# Patient Record
Sex: Male | Born: 1975 | Race: White | Hispanic: No | Marital: Single | Smoking: Current every day smoker
Health system: Southern US, Community
[De-identification: ages and names within clinical notes are randomized; demographics above are authoritative.]

## PROBLEM LIST (undated history)

## (undated) DIAGNOSIS — I10 Essential (primary) hypertension: Secondary | ICD-10-CM

## (undated) DIAGNOSIS — R519 Headache, unspecified: Secondary | ICD-10-CM

## (undated) HISTORY — PX: APPENDECTOMY: SHX54

---

## 2000-11-04 ENCOUNTER — Emergency Department (HOSPITAL_COMMUNITY): Admission: EM | Admit: 2000-11-04 | Discharge: 2000-11-04 | Payer: Self-pay | Admitting: *Deleted

## 2000-11-19 ENCOUNTER — Emergency Department (HOSPITAL_COMMUNITY): Admission: EM | Admit: 2000-11-19 | Discharge: 2000-11-19 | Payer: Self-pay | Admitting: Emergency Medicine

## 2000-11-19 ENCOUNTER — Encounter: Payer: Self-pay | Admitting: Emergency Medicine

## 2001-02-19 ENCOUNTER — Emergency Department (HOSPITAL_COMMUNITY): Admission: EM | Admit: 2001-02-19 | Discharge: 2001-02-19 | Payer: Self-pay | Admitting: Emergency Medicine

## 2001-02-19 ENCOUNTER — Encounter: Payer: Self-pay | Admitting: Emergency Medicine

## 2001-04-05 ENCOUNTER — Emergency Department (HOSPITAL_COMMUNITY): Admission: EM | Admit: 2001-04-05 | Discharge: 2001-04-05 | Payer: Self-pay | Admitting: *Deleted

## 2001-04-22 ENCOUNTER — Emergency Department (HOSPITAL_COMMUNITY): Admission: EM | Admit: 2001-04-22 | Discharge: 2001-04-22 | Payer: Self-pay | Admitting: Emergency Medicine

## 2001-04-24 ENCOUNTER — Emergency Department (HOSPITAL_COMMUNITY): Admission: EM | Admit: 2001-04-24 | Discharge: 2001-04-24 | Payer: Self-pay | Admitting: Emergency Medicine

## 2001-05-09 ENCOUNTER — Encounter: Payer: Self-pay | Admitting: Emergency Medicine

## 2001-05-09 ENCOUNTER — Emergency Department (HOSPITAL_COMMUNITY): Admission: EM | Admit: 2001-05-09 | Discharge: 2001-05-09 | Payer: Self-pay | Admitting: Emergency Medicine

## 2001-07-08 ENCOUNTER — Emergency Department (HOSPITAL_COMMUNITY): Admission: EM | Admit: 2001-07-08 | Discharge: 2001-07-08 | Payer: Self-pay | Admitting: *Deleted

## 2001-08-22 ENCOUNTER — Encounter: Payer: Self-pay | Admitting: *Deleted

## 2001-08-22 ENCOUNTER — Emergency Department (HOSPITAL_COMMUNITY): Admission: EM | Admit: 2001-08-22 | Discharge: 2001-08-22 | Payer: Self-pay | Admitting: *Deleted

## 2001-09-07 ENCOUNTER — Emergency Department (HOSPITAL_COMMUNITY): Admission: EM | Admit: 2001-09-07 | Discharge: 2001-09-07 | Payer: Self-pay | Admitting: Emergency Medicine

## 2001-09-10 ENCOUNTER — Inpatient Hospital Stay (HOSPITAL_COMMUNITY): Admission: EM | Admit: 2001-09-10 | Discharge: 2001-09-12 | Payer: Self-pay | Admitting: Emergency Medicine

## 2001-09-10 ENCOUNTER — Encounter: Payer: Self-pay | Admitting: Emergency Medicine

## 2001-09-23 ENCOUNTER — Emergency Department (HOSPITAL_COMMUNITY): Admission: EM | Admit: 2001-09-23 | Discharge: 2001-09-24 | Payer: Self-pay | Admitting: *Deleted

## 2002-08-06 ENCOUNTER — Emergency Department (HOSPITAL_COMMUNITY): Admission: EM | Admit: 2002-08-06 | Discharge: 2002-08-06 | Payer: Self-pay | Admitting: Emergency Medicine

## 2002-08-06 ENCOUNTER — Encounter: Payer: Self-pay | Admitting: *Deleted

## 2002-10-22 ENCOUNTER — Emergency Department (HOSPITAL_COMMUNITY): Admission: AD | Admit: 2002-10-22 | Discharge: 2002-10-22 | Payer: Self-pay | Admitting: Family Medicine

## 2003-08-22 ENCOUNTER — Emergency Department (HOSPITAL_COMMUNITY): Admission: EM | Admit: 2003-08-22 | Discharge: 2003-08-22 | Payer: Self-pay | Admitting: Emergency Medicine

## 2003-09-07 ENCOUNTER — Emergency Department (HOSPITAL_COMMUNITY): Admission: EM | Admit: 2003-09-07 | Discharge: 2003-09-07 | Payer: Self-pay | Admitting: Emergency Medicine

## 2005-05-02 ENCOUNTER — Emergency Department (HOSPITAL_COMMUNITY): Admission: EM | Admit: 2005-05-02 | Discharge: 2005-05-02 | Payer: Self-pay | Admitting: Emergency Medicine

## 2005-06-01 ENCOUNTER — Encounter (INDEPENDENT_AMBULATORY_CARE_PROVIDER_SITE_OTHER): Payer: Self-pay | Admitting: *Deleted

## 2005-06-01 ENCOUNTER — Other Ambulatory Visit: Admission: RE | Admit: 2005-06-01 | Discharge: 2005-06-01 | Payer: Self-pay | Admitting: Urology

## 2005-10-16 ENCOUNTER — Emergency Department (HOSPITAL_COMMUNITY): Admission: EM | Admit: 2005-10-16 | Discharge: 2005-10-16 | Payer: Self-pay | Admitting: Family Medicine

## 2006-09-17 ENCOUNTER — Emergency Department (HOSPITAL_COMMUNITY): Admission: EM | Admit: 2006-09-17 | Discharge: 2006-09-17 | Payer: Self-pay | Admitting: Emergency Medicine

## 2007-07-07 ENCOUNTER — Emergency Department (HOSPITAL_COMMUNITY): Admission: EM | Admit: 2007-07-07 | Discharge: 2007-07-07 | Payer: Self-pay | Admitting: Emergency Medicine

## 2010-06-03 NOTE — Discharge Summary (Signed)
NAME:  Harold Castillo, Harold Castillo                       ACCOUNT NO.:  [id number]   MEDICAL RECORD NO.:  [id number]                   PATIENT TYPE:  INP   LOCATION:  A320                                 FACILITY:  [facility]   PHYSICIAN:  Dirk Dress. Katrinka Blazing, M.D.                DATE OF BIRTH:  August 22, 1975   DATE OF ADMISSION:  09/09/2001  DATE OF DISCHARGE:  09/12/2001                                 DISCHARGE SUMMARY   DISCHARGE DIAGNOSIS:  Acute appendicitis.   SPECIAL PROCEDURE:  Laparoscopic appendectomy.   DISPOSITION:  Patient discharged home in stable condition and satisfactory  condition, with plans for follow-up in the office in one week.   DISCHARGE MEDICATIONS:  1. Levaquin 500 mg q.d.  2. Tylox 1 q.i.d. p.r.n. pain.   HOSPITAL COURSE:  The patient is a 36 year old male with a five-day history  of diffuse abdominal pain without nausea or vomiting.  The pain localized to  the right lower quadrant and gradually increased in severity.  He was  uncomfortable with all movement.  He was evaluated in the emergency room and  was found to have a thickening of the lining of the appendix with a small  amount of free fluid in the pelvis.  The patient had no other medical  problems.  Physical findings were compatible with acute appendicitis.  White  count was 11,800.  The patient was started on Zosyn and Cleocin.  He was  taken to the operating room where an inflamed appendix was removed  laparoscopically.  He was treated with IV antibiotics for 1-1/2 days  postoperatively.  Otherwise, he did very well.  By September 12, 2001, he was  stable and ready for discharge.  He was tolerating a diet without any nausea  or vomiting, and he was afebrile.  He was discharged home in stable and  satisfactory condition.                                               Dirk Dress. Katrinka Blazing, M.D.    LCS/MEDQ  D:  10/12/2001  T:  10/14/2001  Job:  [id number]

## 2010-06-03 NOTE — Op Note (Signed)
NAME:  Harold Castillo, Harold Castillo                       ACCOUNT NO.:  [id number]   MEDICAL RECORD NO.:  [id number]                   PATIENT TYPE:  INP   LOCATION:  A320                                 FACILITY:  [facility]   PHYSICIAN:  Dirk Dress. Katrinka Blazing, M.D.                DATE OF BIRTH:  10/14/1975   DATE OF PROCEDURE:  09/10/2001  DATE OF DISCHARGE:                                 OPERATIVE REPORT   PREOPERATIVE DIAGNOSIS:  Acute appendicitis.   POSTOPERATIVE DIAGNOSIS:  Acute appendicitis.   PROCEDURE:  Laparoscopic appendectomy.   SURGEON:  Dirk Dress. Katrinka Blazing, M.D.   DESCRIPTION OF PROCEDURE:  Under general anesthesia, the patient's abdomen  was prepped and draped in the sterile field.  A supraumbilical incision was  made.  The Veress needle was inserted uneventfully.  The abdomen was  insufflated with two liters of CO2.  Using Visiport ______, a 10 mm port was  placed uneventfully.  The appendix was visualized.  There was acute  inflammation of the appendix which now appears as appendiceal inflammation  and multiple chronic adhesions to the lateral pelvic gutter.  There was a  small amount of fluid in the pelvis which was suctioned.  A 5 mm port was  placed in the suprapubic midline, and a 12 mm port was placed in the left  lower quadrant under videoscopic guidance.  The appendix was grasped.  Using  blunt dissection, it was separated from the lateral peritoneum.  The vessels  were clipped with multiple hemoclips and divided.  The base of the appendix  was dissected and then was transected using a Endo-GIA stapler.  There was  no bleeding from the staple line.  Copious irrigation was carried out.  Irrigation of the left and right upper quadrant and the pelvis was carried  out.  Inspection of the operative site revealed no oozing or bleeding.  There was no evidence of visceral injury.  The CO2 was allowed to escape  from the abdomen, and the ports were removed.  The incisions were closed  using 0 Dexon on the fascia of the larger incision and staples on the skin.  The patient tolerated the procedure well.  He was awakened from anesthesia  uneventfully, transferred to a bed and taken to the post anesthetic care  unit.                                               Dirk Dress. Katrinka Blazing, M.D.    LCS/MEDQ  D:  09/10/2001  T:  09/11/2001  Job:  [id number]

## 2012-01-14 ENCOUNTER — Encounter (HOSPITAL_COMMUNITY): Payer: Self-pay | Admitting: Emergency Medicine

## 2012-01-14 ENCOUNTER — Emergency Department (HOSPITAL_COMMUNITY): Payer: Self-pay

## 2012-01-14 ENCOUNTER — Emergency Department (HOSPITAL_COMMUNITY)
Admission: EM | Admit: 2012-01-14 | Discharge: 2012-01-14 | Disposition: A | Discharge status: Home / Self Care | Payer: Self-pay | Attending: Emergency Medicine | Admitting: Emergency Medicine

## 2012-01-14 DIAGNOSIS — F17200 Nicotine dependence, unspecified, uncomplicated: Secondary | ICD-10-CM | POA: Insufficient documentation

## 2012-01-14 DIAGNOSIS — S46911A Strain of unspecified muscle, fascia and tendon at shoulder and upper arm level, right arm, initial encounter: Secondary | ICD-10-CM

## 2012-01-14 DIAGNOSIS — IMO0002 Reserved for concepts with insufficient information to code with codable children: Secondary | ICD-10-CM | POA: Insufficient documentation

## 2012-01-14 MED ORDER — OXYCODONE-ACETAMINOPHEN 5-325 MG PO TABS: 1.0000 | ORAL_TABLET | Freq: Four times a day (QID) | ORAL | Status: DC | PRN

## 2012-01-14 MED ORDER — OXYCODONE-ACETAMINOPHEN 5-325 MG PO TABS
1.0000 | ORAL_TABLET | Freq: Once | ORAL | Status: AC
  Administered 2012-01-14: 1 via ORAL
  Filled 2012-01-14: qty 1
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
## 2012-04-23 ENCOUNTER — Encounter (HOSPITAL_COMMUNITY): Payer: Self-pay | Admitting: *Deleted

## 2012-04-23 ENCOUNTER — Emergency Department (HOSPITAL_COMMUNITY)
Admission: EM | Admit: 2012-04-23 | Discharge: 2012-04-23 | Disposition: A | Discharge status: Home / Self Care | Payer: Self-pay | Attending: Emergency Medicine | Admitting: Emergency Medicine

## 2012-04-23 DIAGNOSIS — K029 Dental caries, unspecified: Secondary | ICD-10-CM | POA: Insufficient documentation

## 2012-04-23 DIAGNOSIS — R220 Localized swelling, mass and lump, head: Secondary | ICD-10-CM | POA: Insufficient documentation

## 2012-04-23 DIAGNOSIS — F17200 Nicotine dependence, unspecified, uncomplicated: Secondary | ICD-10-CM | POA: Insufficient documentation

## 2012-04-23 MED ORDER — OXYCODONE-ACETAMINOPHEN 5-325 MG PO TABS: 1.0000 | ORAL_TABLET | Freq: Four times a day (QID) | ORAL | Status: DC | PRN

## 2012-04-23 MED ORDER — BUPIVACAINE HCL (PF) 0.5 % IJ SOLN
50.0000 mL | Freq: Once | INTRAMUSCULAR | Status: DC
  Filled 2012-04-23: qty 30

## 2012-04-23 NOTE — ED Notes (Signed)
Pt states that tooth has been bothering him for about 2 days now it has been chipped for a while but now it is very painful. He states that he cannot chew or bite down on side where his tooth is and that it hurts so bad that he cannot sit still. Pt states he does not have insurance so the dentist will not see him, "he just wants the tooth out now"

## 2012-04-23 NOTE — ED Provider Notes (Signed)
History     CSN: [id number]  Arrival date & time 04/23/12  0133   First MD Initiated Contact with Patient 04/23/12 [phone]      Chief Complaint  Patient presents with  . Dental Pain    (Consider location/radiation/quality/duration/timing/severity/associated sxs/prior treatment) HPI Comments: Harold Castillo has very poor dentition.  Now complaining of right lower molar pain with gum swelling.  States he cannot get into a dentist.  Due to financial concerns and lack of dental insurance  Patient is a 37 y.o. male presenting with tooth pain. The history is provided by the patient.  Dental PainThe primary symptoms include mouth pain. Primary symptoms do not include dental injury or fever. The symptoms began 3 to 5 days ago. The symptoms are worsening. The symptoms occur constantly.  Additional symptoms include: gum swelling and gum tenderness.    History reviewed. No pertinent past medical history.  Past Surgical History  Procedure Laterality Date  . Appendectomy      History reviewed. No pertinent family history.  History  Substance Use Topics  . Smoking status: Current Every Day Smoker  . Smokeless tobacco: Not on file  . Alcohol Use: No      Review of Systems  Constitutional: Negative for fever and chills.  HENT: Positive for dental problem.   All other systems reviewed and are negative.    Allergies  Review of patient's allergies indicates no known allergies.  Home Medications   Current Outpatient Rx  Name  Route  Sig  Dispense  Refill  . acetaminophen (TYLENOL) 500 MG tablet   Oral   Take 500-1,000 mg by mouth every 6 (six) hours as needed. For pain         . oxyCODONE-acetaminophen (PERCOCET/ROXICET) 5-325 MG per tablet   Oral   Take 1 tablet by mouth every 6 (six) hours as needed for pain.           BP 126/82  Pulse 58  Temp(Src) 97.9 F (36.6 C) (Oral)  Resp 20  SpO2 96%  Physical Exam  Nursing note and vitals reviewed. Constitutional: He  appears well-developed and well-nourished.  HENT:  Head: Normocephalic.  Mouth/Throat:    Extensive dental disease.  Multiple care is no tooth without some degree of decay  Eyes: Pupils are equal, round, and reactive to light.  Neck: Normal range of motion.  Cardiovascular: Normal rate.   Pulmonary/Chest: Effort normal.  Musculoskeletal: Normal range of motion.  Lymphadenopathy:    He has no cervical adenopathy.  Neurological: He is alert.  Skin: Skin is warm.    ED Course  Dental Date/Time: 04/23/2012 5:02 AM Performed by: Arman Filter Authorized by: Arman Filter Consent: Verbal consent obtained. Risks and benefits: risks, benefits and alternatives were discussed Consent given by: patient Patient understanding: patient states understanding of the procedure being performed Patient identity confirmed: verbally with patient Time out: Immediately prior to procedure a "time out" was called to verify the correct patient, procedure, equipment, support staff and site/side marked as required. Local anesthesia used: yes Anesthesia: nerve block Local anesthetic: bupivacaine 0.5% with epinephrine Anesthetic total: 0.9 ml Patient sedated: no Patient tolerance: Patient tolerated the procedure well with no immediate complications.   (including critical care time)  Labs Reviewed - No data to display No results found.   No diagnosis found.    MDM  Dental block performed patient will be given, referral to dentist on call, with instructions that he needs to call first thing in the  morning to set an evaluation.  Time         Arman Filter, NP 04/23/12 [phone]

## 2012-04-23 NOTE — ED Notes (Signed)
Right lower bottom  Cavity , pt is tearful,  States it has hurt for two days.

## 2012-04-29 NOTE — ED Provider Notes (Signed)
Medical screening examination/treatment/procedure(s) were performed by non-physician practitioner and as supervising physician I was immediately available for consultation/collaboration.  Hurman Horn, MD 04/29/12 [phone]

## 2012-06-09 ENCOUNTER — Emergency Department (HOSPITAL_COMMUNITY)
Admission: EM | Admit: 2012-06-09 | Discharge: 2012-06-09 | Disposition: A | Discharge status: Home / Self Care | Payer: Self-pay | Attending: Emergency Medicine | Admitting: Emergency Medicine

## 2012-06-09 ENCOUNTER — Encounter (HOSPITAL_COMMUNITY): Payer: Self-pay | Admitting: *Deleted

## 2012-06-09 ENCOUNTER — Emergency Department (HOSPITAL_COMMUNITY): Payer: Self-pay

## 2012-06-09 DIAGNOSIS — I10 Essential (primary) hypertension: Secondary | ICD-10-CM | POA: Insufficient documentation

## 2012-06-09 DIAGNOSIS — F17200 Nicotine dependence, unspecified, uncomplicated: Secondary | ICD-10-CM | POA: Insufficient documentation

## 2012-06-09 DIAGNOSIS — R0789 Other chest pain: Secondary | ICD-10-CM

## 2012-06-09 DIAGNOSIS — R000 Tachycardia, unspecified: Secondary | ICD-10-CM | POA: Insufficient documentation

## 2012-06-09 DIAGNOSIS — R071 Chest pain on breathing: Secondary | ICD-10-CM | POA: Insufficient documentation

## 2012-06-09 HISTORY — DX: Essential (primary) hypertension: I10

## 2012-06-09 LAB — CBC WITH DIFFERENTIAL/PLATELET
Basophils Absolute: 0 10*3/uL (ref 0.0–0.1)
Basophils Relative: 0 % (ref 0–1)
Hemoglobin: 15.8 g/dL (ref 13.0–17.0)
MCHC: 35 g/dL (ref 30.0–36.0)
Neutro Abs: 10.1 10*3/uL — ABNORMAL HIGH (ref 1.7–7.7)
Neutrophils Relative %: 78 % — ABNORMAL HIGH (ref 43–77)
RDW: 12.7 % (ref 11.5–15.5)
WBC: 12.8 10*3/uL — ABNORMAL HIGH (ref 4.0–10.5)

## 2012-06-09 LAB — COMPREHENSIVE METABOLIC PANEL
ALT: 21 U/L (ref 0–53)
AST: 29 U/L (ref 0–37)
Albumin: 4.3 g/dL (ref 3.5–5.2)
Alkaline Phosphatase: 84 U/L (ref 39–117)
Chloride: 102 mEq/L (ref 96–112)
Potassium: 4 mEq/L (ref 3.5–5.1)
Total Bilirubin: 0.6 mg/dL (ref 0.3–1.2)

## 2012-06-09 LAB — PROTIME-INR: Prothrombin Time: 12.8 seconds (ref 11.6–15.2)

## 2012-06-09 MED ORDER — LORAZEPAM 2 MG/ML IJ SOLN
2.0000 mg | Freq: Once | INTRAMUSCULAR | Status: AC
  Administered 2012-06-09: 2 mg via INTRAVENOUS
  Filled 2012-06-09: qty 1

## 2012-06-09 MED ORDER — KETOROLAC TROMETHAMINE 30 MG/ML IJ SOLN
30.0000 mg | Freq: Once | INTRAMUSCULAR | Status: AC
  Administered 2012-06-09: 30 mg via INTRAVENOUS
  Filled 2012-06-09: qty 1

## 2012-06-09 MED ORDER — METHOCARBAMOL 750 MG PO TABS: 750.0000 mg | ORAL_TABLET | Freq: Four times a day (QID) | ORAL | Status: DC

## 2012-06-09 MED ORDER — MORPHINE SULFATE 4 MG/ML IJ SOLN
4.0000 mg | Freq: Once | INTRAMUSCULAR | Status: AC
  Administered 2012-06-09: 4 mg via INTRAVENOUS
  Filled 2012-06-09: qty 1

## 2012-06-09 MED ORDER — OXYCODONE-ACETAMINOPHEN 5-325 MG PO TABS: 2.0000 | ORAL_TABLET | ORAL | Status: DC | PRN

## 2012-06-09 MED ORDER — IBUPROFEN 600 MG PO TABS: 600.0000 mg | ORAL_TABLET | Freq: Four times a day (QID) | ORAL | Status: DC | PRN

## 2012-06-09 NOTE — ED Notes (Signed)
Pt developed chest pain worse with movement, started this morning, symptoms have become worse today.

## 2012-06-09 NOTE — ED Provider Notes (Signed)
History     CSN: [id number]  Arrival date & time 06/09/12  1956   First MD Initiated Contact with Patient 06/09/12 2023      Chief Complaint  Patient presents with  . Chest Pain    (Consider location/radiation/quality/duration/timing/severity/associated sxs/prior treatment) Patient is a 37 y.o. male presenting with chest pain. The history is provided by the patient.  Chest Pain  patient here complaining of chest wall pain since this morning. Describes as a sharp pain worse with movement and taking a deep breath. Does work as a Administrator. Denies any anginal type symptoms. Denies any dyspnea diaphoresis. Used NSAIDs without relief. Denies any rashes to his chest. Denies exertional dyspnea. No prior history of same. Denies the leg pain or swelling  Past Medical History  Diagnosis Date  . Hypertension     Past Surgical History  Procedure Laterality Date  . Appendectomy      No family history on file.  History  Substance Use Topics  . Smoking status: Current Every Day Smoker  . Smokeless tobacco: Not on file  . Alcohol Use: No      Review of Systems  Cardiovascular: Positive for chest pain.  All other systems reviewed and are negative.    Allergies  Review of patient's allergies indicates no known allergies.  Home Medications   Current Outpatient Rx  Name  Route  Sig  Dispense  Refill  . acetaminophen (TYLENOL) 500 MG tablet   Oral   Take 500-1,000 mg by mouth every 6 (six) hours as needed. For pain         . oxyCODONE-acetaminophen (PERCOCET/ROXICET) 5-325 MG per tablet   Oral   Take 1 tablet by mouth every 6 (six) hours as needed for pain.   17 tablet   0     BP 131/81  Pulse 101  Temp(Src) 99.7 F (37.6 C) (Oral)  Resp 20  SpO2 97%  Physical Exam  Nursing note and vitals reviewed. Constitutional: He is oriented to person, place, and time. He appears well-developed and well-nourished.  Non-toxic appearance. No distress.  HENT:  Head:  Normocephalic and atraumatic.  Eyes: Conjunctivae, EOM and lids are normal. Pupils are equal, round, and reactive to light.  Neck: Normal range of motion. Neck supple. No tracheal deviation present. No mass present.  Cardiovascular: Regular rhythm and normal heart sounds.  Tachycardia present.  Exam reveals no gallop.   No murmur heard. Pulmonary/Chest: Effort normal and breath sounds normal. No stridor. No respiratory distress. He has no decreased breath sounds. He has no wheezes. He has no rhonchi. He has no rales.    Abdominal: Soft. Normal appearance and bowel sounds are normal. He exhibits no distension. There is no tenderness. There is no rebound and no CVA tenderness.  Musculoskeletal: Normal range of motion. He exhibits no edema and no tenderness.  Neurological: He is alert and oriented to person, place, and time. He has normal strength. No cranial nerve deficit or sensory deficit. GCS eye subscore is 4. GCS verbal subscore is 5. GCS motor subscore is 6.  Skin: Skin is warm and dry. No abrasion and no rash noted.  Psychiatric: He has a normal mood and affect. His speech is normal and behavior is normal.    ED Course  Procedures (including critical care time)  Labs Reviewed  CBC WITH DIFFERENTIAL - Abnormal; Notable for the following:    WBC 12.8 (*)    Platelets 148 (*)    Neutrophils Relative % 78 (*)  Neutro Abs 10.1 (*)    Lymphocytes Relative 11 (*)    Monocytes Absolute 1.2 (*)    All other components within normal limits  COMPREHENSIVE METABOLIC PANEL  URINALYSIS, ROUTINE W REFLEX MICROSCOPIC  PROTIME-INR  URINE RAPID DRUG SCREEN (HOSP PERFORMED)   No results found.   No diagnosis found.    MDM   Date: 06/09/2012  Rate: 998  Rhythm: normal sinus rhythm  QRS Axis: left  Intervals: normal  ST/T Wave abnormalities: nonspecific ST/T changes  Conduction Disutrbances:none  Narrative Interpretation: lvh  Old EKG Reviewed: none available  11:00 PM Pt given  meds and feels better--suspect chest wall pain, no concern for acs or pe--stable for d/c        Toy Baker, MD 06/09/12 2303

## 2012-06-10 LAB — RAPID URINE DRUG SCREEN, HOSP PERFORMED
Barbiturates: NOT DETECTED
Benzodiazepines: NOT DETECTED
Cocaine: NOT DETECTED
Tetrahydrocannabinol: POSITIVE — AB

## 2012-06-10 LAB — URINALYSIS, ROUTINE W REFLEX MICROSCOPIC
Glucose, UA: NEGATIVE mg/dL
Hgb urine dipstick: NEGATIVE
Ketones, ur: NEGATIVE mg/dL
Protein, ur: NEGATIVE mg/dL

## 2014-10-14 ENCOUNTER — Emergency Department (HOSPITAL_COMMUNITY)
Admission: EM | Admit: 2014-10-14 | Discharge: 2014-10-14 | Disposition: A | Discharge status: Home / Self Care | Payer: Self-pay | Attending: Emergency Medicine | Admitting: Emergency Medicine

## 2014-10-14 ENCOUNTER — Encounter (HOSPITAL_COMMUNITY): Payer: Self-pay | Admitting: Emergency Medicine

## 2014-10-14 DIAGNOSIS — I10 Essential (primary) hypertension: Secondary | ICD-10-CM | POA: Insufficient documentation

## 2014-10-14 DIAGNOSIS — Z79899 Other long term (current) drug therapy: Secondary | ICD-10-CM | POA: Insufficient documentation

## 2014-10-14 DIAGNOSIS — Z720 Tobacco use: Secondary | ICD-10-CM | POA: Insufficient documentation

## 2014-10-14 DIAGNOSIS — K002 Abnormalities of size and form of teeth: Secondary | ICD-10-CM | POA: Insufficient documentation

## 2014-10-14 DIAGNOSIS — K029 Dental caries, unspecified: Secondary | ICD-10-CM | POA: Insufficient documentation

## 2014-10-14 MED ORDER — PENICILLIN V POTASSIUM 500 MG PO TABS: 500.0000 mg | ORAL_TABLET | Freq: Two times a day (BID) | ORAL | Status: AC

## 2014-10-14 MED ORDER — BUPIVACAINE-EPINEPHRINE (PF) 0.5% -1:200000 IJ SOLN
1.8000 mL | Freq: Once | INTRAMUSCULAR | Status: AC
Start: 2014-10-14 — End: 2014-10-14
  Administered 2014-10-14: 1.8 mL
  Filled 2014-10-14: qty 1.8

## 2014-10-14 NOTE — ED Provider Notes (Signed)
CSN: [id number]     Arrival date & time 10/14/14  9147 History   First MD Initiated Contact with Patient 10/14/14 1003     Chief Complaint  Patient presents with  . Dental Pain     (Consider location/radiation/quality/duration/timing/severity/associated sxs/prior Treatment) HPI Comments: Pt is a 39 yo male with PMH of dental caries who presents to the ED with complaint of dental pain, onset 1 week. Pt endorses having worsening pain to his upper and lower left molars. He notes he has been putting Goody's powder on his teeth with mild relief. Pt states he has had multiple dental caries in the past. Pt is currently not being seen by a dentist. Denies fever, chills, headache, neck swelling, dysphagia, SOB, trismus, drainage, swelling.    Past Medical History  Diagnosis Date  . Hypertension    Past Surgical History  Procedure Laterality Date  . Appendectomy     No family history on file. Social History  Substance Use Topics  . Smoking status: Current Every Day Smoker  . Smokeless tobacco: None  . Alcohol Use: No    Review of Systems  Constitutional: Negative for fever and chills.  HENT: Positive for dental problem. Negative for drooling, facial swelling, sore throat and trouble swallowing.   Respiratory: Negative for cough and shortness of breath.   Gastrointestinal: Negative for nausea and vomiting.  Neurological: Negative for numbness.      Allergies  Review of patient's allergies indicates no known allergies.  Home Medications   Prior to Admission medications   Medication Sig Start Date End Date Taking? Authorizing Provider  Aspirin-Acetaminophen-Caffeine (GOODYS EXTRA STRENGTH) [phone] MG PACK Take 1 Package by mouth every 6 (six) hours as needed (for pain).   Yes Historical Provider, MD  ibuprofen (ADVIL,MOTRIN) 200 MG tablet Take 400 mg by mouth every 6 (six) hours as needed for headache, mild pain or moderate pain.   Yes Historical Provider, MD  vitamin B-12  (CYANOCOBALAMIN) 1000 MCG tablet Take 1,000 mcg by mouth daily.   Yes Historical Provider, MD   BP 119/65 mmHg  Pulse 75  Temp(Src) 98.1 F (36.7 C) (Oral)  Resp 17  SpO2 98% Physical Exam  Constitutional: He is oriented to person, place, and time. He appears well-developed and well-nourished. No distress.  HENT:  Head: Normocephalic and atraumatic.  Mouth/Throat: Uvula is midline, oropharynx is clear and moist and mucous membranes are normal. No trismus in the jaw. Abnormal dentition. Dental caries present. No dental abscesses.    Eyes: Conjunctivae and EOM are normal. Right eye exhibits no discharge. Left eye exhibits no discharge. No scleral icterus.  Neck: Normal range of motion. Neck supple.  Pulmonary/Chest: Effort normal.  Lymphadenopathy:    He has no cervical adenopathy.  Neurological: He is alert and oriented to person, place, and time.  Skin: He is not diaphoretic.  Nursing note and vitals reviewed.   ED Course  Procedures (including critical care time) Labs Review Labs Reviewed - No data to display  Imaging Review No results found. I have personally reviewed and evaluated these images and lab results as part of my medical decision-making.  Filed Vitals:   10/14/14 1139  BP: 121/65  Pulse: 78  Temp:   Resp: 18     MDM   Final diagnoses:  Dental caries    Pt presents with dental pain. Pt reports having history of dental problems. He endorses smoking. Denies fever, headache, neck swelling, drainage, SOB, dysphagia. VSS. Poor dentition with multiple decaying teeth  noted on exam, no abscess. Dental block performed by Dr. Gwendolyn Grant to upper and lower left molars. Pt reports pain has improved. Plan to d/c pt with dental follow up.  Evaluation does not show pathology requring ongoing emergent intervention or admission. Pt is hemodynamically stable and mentating appropriately. Discussed findings/results and plan with patient/guardian, who agrees with plan. All  questions answered. Return precautions discussed and outpatient follow up given.    Satira Sark Perry, [place] 10/14/14 1250  Elwin Mocha, MD 10/14/14 [phone]

## 2014-10-14 NOTE — Discharge Instructions (Signed)
Please take your prescription as prescribed. You may take  Ibuprofen three times a day with food for pain relief. Please follow up with a dentist. Please return to the Emergency Department if symptoms worsen or new onset of fever, neck swelling, difficulty swallowing, difficulty breathing.  [facility] [address] [place], [place] [address] Phone [phone]  [facility] [employer] [facility] [place] exemplifies the [employer] vision to improve the health and quality of life of all [place]ians by Public house manager with a passion to care for the underserved and by leading the nation in community-based, service learning oral health education.  We are committed to offering comprehensive general dental services for adults, children and special needs patients in a safe, caring and professional setting.   Appointments: Our clinic is open Monday through Friday 8:00 a.m. until 5:00 p.m. The amount of time scheduled for an appointment depends on the patients specific needs. We ask that you keep your appointed time for care or provide 24-hour notice of all appointment changes. Parents or legal guardians must accompany minor children.   Payment for Services: Medicaid and other insurance plans are welcome. Payment for services is due when services are rendered and may be made by cash or credit card. If you have dental insurance, we will assist you with your claim submission.    Emergencies:  Emergency services will be provided Monday through Friday on a walk-in basis.  Please arrive early for emergency services. After hours emergency services will be provided for patients of record as required.   Services:  Armed forces operational officer Dentistry Oral Surgery - Extractions Root Canals Sealants and Tooth  Colored Fillings Crowns and Bridges Dentures and Partial Dentures Implant Services Periodontal Services and Cleanings Cosmetic Armed forces operational officer 3-D/[facility]

## 2014-10-14 NOTE — ED Notes (Signed)
Per pt, states tooth pain on upper and lower left side

## 2014-10-14 NOTE — ED Provider Notes (Signed)
Medical screening examination/treatment/procedure(s) were conducted as a shared visit with non-physician practitioner(s) and myself.  I personally evaluated the patient during the encounter.   EKG Interpretation None       NERVE BLOCK Date/Time: 10/14/2014 11:26 AM Performed by: Elwin Mocha Authorized by: Elwin Mocha Consent: Verbal consent obtained. Indications: pain relief Body area: face/mouth Nerve: inferior alveolar Laterality: left Patient sedated: no Patient position: sitting Needle gauge: 27 G Location technique: anatomical landmarks Local anesthetic: bupivacaine 0.25% without epinephrine Anesthetic total: 1 ml Outcome: pain improved Patient tolerance: Patient tolerated the procedure well with no immediate complications  NERVE BLOCK Date/Time: 10/14/2014 11:27 AM Performed by: Elwin Mocha Authorized by: Elwin Mocha Consent: Verbal consent obtained. Indications: pain relief Body area: face/mouth Nerve: posterior superior alveolar Laterality: left Patient sedated: no Preparation: Patient was prepped and draped in the usual sterile fashion. Patient position: sitting Needle gauge: 27 G Location technique: anatomical landmarks Local anesthetic: bupivacaine 0.25% without epinephrine Anesthetic total: 1 ml Outcome: pain improved Patient tolerance: Patient tolerated the procedure well with no immediate complications     Elwin Mocha, MD 10/14/14 1127

## 2014-12-30 ENCOUNTER — Encounter (HOSPITAL_COMMUNITY): Payer: Self-pay

## 2014-12-30 ENCOUNTER — Emergency Department (HOSPITAL_COMMUNITY)
Admission: EM | Admit: 2014-12-30 | Discharge: 2014-12-30 | Disposition: A | Discharge status: Home / Self Care | Payer: Self-pay | Attending: Emergency Medicine | Admitting: Emergency Medicine

## 2014-12-30 ENCOUNTER — Emergency Department (HOSPITAL_COMMUNITY): Payer: Self-pay

## 2014-12-30 DIAGNOSIS — S99921A Unspecified injury of right foot, initial encounter: Secondary | ICD-10-CM | POA: Insufficient documentation

## 2014-12-30 DIAGNOSIS — M79671 Pain in right foot: Secondary | ICD-10-CM

## 2014-12-30 DIAGNOSIS — W208XXA Other cause of strike by thrown, projected or falling object, initial encounter: Secondary | ICD-10-CM | POA: Insufficient documentation

## 2014-12-30 DIAGNOSIS — F17200 Nicotine dependence, unspecified, uncomplicated: Secondary | ICD-10-CM | POA: Insufficient documentation

## 2014-12-30 DIAGNOSIS — Y9389 Activity, other specified: Secondary | ICD-10-CM | POA: Insufficient documentation

## 2014-12-30 DIAGNOSIS — Y9289 Other specified places as the place of occurrence of the external cause: Secondary | ICD-10-CM | POA: Insufficient documentation

## 2014-12-30 DIAGNOSIS — Z79899 Other long term (current) drug therapy: Secondary | ICD-10-CM | POA: Insufficient documentation

## 2014-12-30 DIAGNOSIS — Y998 Other external cause status: Secondary | ICD-10-CM | POA: Insufficient documentation

## 2014-12-30 DIAGNOSIS — I10 Essential (primary) hypertension: Secondary | ICD-10-CM | POA: Insufficient documentation

## 2014-12-30 MED ORDER — ACETAMINOPHEN 325 MG PO TABS
650.0000 mg | ORAL_TABLET | Freq: Once | ORAL | Status: AC
  Administered 2014-12-30: 650 mg via ORAL
  Filled 2014-12-30: qty 2

## 2014-12-30 NOTE — Discharge Instructions (Signed)

## 2014-12-30 NOTE — ED Provider Notes (Signed)
CSN: [id number]     Arrival date & time 12/30/14  1809 History   By signing my name below, I, Evon Slack, attest that this documentation has been prepared under the direction and in the presence of Clayton Bosserman, PA-C. Electronically Signed: Evon Slack, ED Scribe. 12/30/2014. 8:30 PM.      Chief Complaint  Patient presents with  . Foot Injury    Patient is a 39 y.o. male presenting with foot injury. The history is provided by the patient. No language interpreter was used.  Foot Injury  HPI Comments: Harold Castillo is a 40 y.o. male who presents to the Emergency Department complaining of right foot injury onset 1 day prior. Pt states that yesterday he dropped a "bottle jack" onto his foot while getting a christmas tree out of his truck. The bottle jack weighs approximately 40 lbs. Pt states that majority of his pain is located on top of the 2nd and 3rd toe. The pain is constant and aggravated by moving his toes. He reports some numbness to the distal 2nd toe. Pt states that he has tried tylenol with mild relief. Pt states that he is able to ambulate but has been putting all his weight on his heel. He states that the pain is worse when ambulating. Pt has no other complaints today.   Past Medical History  Diagnosis Date  . Hypertension    Past Surgical History  Procedure Laterality Date  . Appendectomy     History reviewed. No pertinent family history. Social History  Substance Use Topics  . Smoking status: Current Every Day Smoker -- 1.00 packs/day  . Smokeless tobacco: None  . Alcohol Use: No    Review of Systems  Musculoskeletal: Positive for arthralgias.  Neurological: Positive for numbness.  All other systems reviewed and are negative.    Allergies  Review of patient's allergies indicates no known allergies.  Home Medications   Prior to Admission medications   Medication Sig Start Date End Date Taking? Authorizing Provider  Aspirin-Acetaminophen-Caffeine  (GOODYS EXTRA STRENGTH) [phone] MG PACK Take 1 Package by mouth every 6 (six) hours as needed (for pain).   Yes Historical Provider, MD  vitamin B-12 (CYANOCOBALAMIN) 1000 MCG tablet Take 1,000 mcg by mouth daily.   Yes Historical Provider, MD  ibuprofen (ADVIL,MOTRIN) 200 MG tablet Take 400 mg by mouth every 6 (six) hours as needed for headache, mild pain or moderate pain.    Historical Provider, MD   BP 131/80 mmHg  Pulse 70  Temp(Src) 97.8 F (36.6 C) (Oral)  Resp 18  Ht  (1.803 m)  Wt 65.772 kg  BMI 20.23 kg/m2  SpO2 96%   Physical Exam  Constitutional: He is oriented to person, place, and time. He appears well-developed and well-nourished. No distress.  HENT:  Head: Normocephalic and atraumatic.  Eyes: Conjunctivae and EOM are normal.  Neck: Neck supple. No tracheal deviation present.  Cardiovascular: Normal rate and intact distal pulses.   Pedal pulses palpable. Cap refill less than 2 seconds.  Pulmonary/Chest: Effort normal. No respiratory distress.  Musculoskeletal: Normal range of motion.       Right foot: There is tenderness. There is normal range of motion, no swelling, normal capillary refill and no deformity.       Feet:  Tenderness over the bases of the second and third toes of the right foot. Retains full range of motion of the toes and ankle. No swelling or obvious deformity noted. Foot compartment soft  Neurological:  He is alert and oriented to person, place, and time.  5/5 strength of the bilateral lower extremities. Sensation to light touch intact throughout.  Skin: Skin is warm and dry. No erythema.  No ecchymosis, erythema, abrasions, lacerations or other wounds noted to the right foot  Psychiatric: He has a normal mood and affect. His behavior is normal.  Nursing note and vitals reviewed.   ED Course  Procedures (including critical care time) DIAGNOSTIC STUDIES: Oxygen Saturation is 98% on RA, normal by my interpretation.    COORDINATION OF  CARE: 7:03 PM-Discussed treatment plan with pt at bedside and pt agreed to plan.     Labs Review Labs Reviewed - No data to display  Imaging Review Dg Foot Complete Right  12/30/2014  CLINICAL DATA:  Dropped car jack on RIGHT foot yesterday, RIGHT foot pain, initial encounter EXAM: RIGHT FOOT COMPLETE - 3+ VIEW COMPARISON:  None FINDINGS: Osseous mineralization normal. Joint spaces preserved. No fracture, dislocation, or bone destruction. Tiny plantar calcaneal spur. IMPRESSION: No acute osseous abnormalities. Electronically Signed   By: Ulyses SouthwardMark  Boles M.D.   On: 12/30/2014 19:03      EKG Interpretation None      MDM   Final diagnoses:  Right foot pain   Patient presenting with right foot pain after dropping a heavy object on it. Right foot is neurovascularly intact with FROM. Patient X-Ray negative for obvious fracture or dislocation. Pain managed in ED with tylenol. Pt is able to ambulate with a steady gait using crutches for assistance. Conservative therapy recommended. Discussed RICE therapy and use of OTC pain relievers. Pt advised to follow up with PCP if symptoms persist. Return precautions discussed at bedside and given in discharge paperwork. Pt is stable for discharge.  I personally performed the services described in this documentation, which was scribed in my presence. The recorded information has been reviewed and is accurate.      Alveta HeimlichStevi Nazirah Tri, PA-C 12/30/14 2030  Zadie Rhineonald Wickline, MD 12/30/14 [phone]

## 2014-12-30 NOTE — ED Notes (Signed)
Pt reports dropping a bottle jack on his right foot. Pt reports feeling like his toes feel like they are separating from the joint. Pt reports taking OTC tylenol for pain. PT has limited movement in toes.

## 2018-02-22 ENCOUNTER — Emergency Department (HOSPITAL_COMMUNITY): Payer: Self-pay

## 2018-02-22 ENCOUNTER — Encounter (HOSPITAL_COMMUNITY): Payer: Self-pay

## 2018-02-22 ENCOUNTER — Emergency Department (HOSPITAL_COMMUNITY)
Admission: EM | Admit: 2018-02-22 | Discharge: 2018-02-22 | Disposition: A | Discharge status: Home / Self Care | Payer: Self-pay | Attending: Emergency Medicine | Admitting: Emergency Medicine

## 2018-02-22 DIAGNOSIS — W208XXA Other cause of strike by thrown, projected or falling object, initial encounter: Secondary | ICD-10-CM | POA: Insufficient documentation

## 2018-02-22 DIAGNOSIS — Z23 Encounter for immunization: Secondary | ICD-10-CM | POA: Insufficient documentation

## 2018-02-22 DIAGNOSIS — S6000XA Contusion of unspecified finger without damage to nail, initial encounter: Secondary | ICD-10-CM

## 2018-02-22 DIAGNOSIS — S61215A Laceration without foreign body of left ring finger without damage to nail, initial encounter: Secondary | ICD-10-CM | POA: Insufficient documentation

## 2018-02-22 DIAGNOSIS — I10 Essential (primary) hypertension: Secondary | ICD-10-CM | POA: Insufficient documentation

## 2018-02-22 DIAGNOSIS — Y9389 Activity, other specified: Secondary | ICD-10-CM | POA: Insufficient documentation

## 2018-02-22 DIAGNOSIS — Z79899 Other long term (current) drug therapy: Secondary | ICD-10-CM | POA: Insufficient documentation

## 2018-02-22 DIAGNOSIS — Y999 Unspecified external cause status: Secondary | ICD-10-CM | POA: Insufficient documentation

## 2018-02-22 DIAGNOSIS — F17210 Nicotine dependence, cigarettes, uncomplicated: Secondary | ICD-10-CM | POA: Insufficient documentation

## 2018-02-22 DIAGNOSIS — Y929 Unspecified place or not applicable: Secondary | ICD-10-CM | POA: Insufficient documentation

## 2018-02-22 MED ORDER — TETANUS-DIPHTH-ACELL PERTUSSIS 5-2.5-18.5 LF-MCG/0.5 IM SUSP
0.5000 mL | Freq: Once | INTRAMUSCULAR | Status: AC
  Administered 2018-02-22: 0.5 mL via INTRAMUSCULAR
  Filled 2018-02-22: qty 0.5

## 2018-02-22 MED ORDER — LIDOCAINE HCL (PF) 1 % IJ SOLN
5.0000 mL | Freq: Once | INTRAMUSCULAR | Status: AC
  Administered 2018-02-22: 5 mL
  Filled 2018-02-22: qty 30

## 2018-02-22 NOTE — ED Triage Notes (Signed)
C/o finger laceration  Putting alternator on truck and it fell on his left hand (ring finger). Patient states he will probably need stiches. Bleeding is controled.   A/Ox4 ambulatory in triage.

## 2018-02-22 NOTE — ED Notes (Signed)
MD at bedside. 

## 2018-02-22 NOTE — ED Provider Notes (Signed)
[facility]-EMERGENCY DEPT Provider Note   CSN: [id number] Arrival date & time: 02/22/18  1533     History   Chief Complaint Chief Complaint  Patient presents with  . Laceration    HPI Harold Castillo is a [age] y.o. male who presents to the ED with left finger injury that occurred just prior to arrival to the ED. Patient reports that he was putting an alternator on a truck and it fell on his left ring finger causing a laceration. Patient denies any other injuries.   HPI  Past Medical History:  Diagnosis Date  . Hypertension     There are no active problems to display for this patient.   Past Surgical History:  Procedure Laterality Date  . APPENDECTOMY          Home Medications    Prior to Admission medications   Medication Sig Start Date End Date Taking? Authorizing Provider  ibuprofen (ADVIL,MOTRIN) 200 MG tablet Take 400 mg by mouth every 6 (six) hours as needed for headache, mild pain or moderate pain.    [provider]  vitamin B-12 (CYANOCOBALAMIN) 1000 MCG tablet Take 1,000 mcg by mouth daily.    [provider]    Family History History reviewed. No pertinent family history.  Social History Social History   Tobacco Use  . Smoking status: Current Every Day Smoker    Packs/day: 1.00  . Smokeless tobacco: Never Used  Substance Use Topics  . Alcohol use: No  . Drug use: Yes    Types: Marijuana     Allergies   Patient has no known allergies.   Review of Systems Review of Systems  Musculoskeletal: Positive for arthralgias.  Skin: Positive for wound.  All other systems reviewed and are negative.    Physical Exam Updated Vital Signs BP (!) 118/91   Pulse 66   Temp 97.7 F (36.5 C) (Oral)   Resp 16   Ht 5\' 11"  (1.803 m)   Wt 68 kg   SpO2 100%   BMI 20.92 kg/m   Physical Exam Vitals signs and nursing note reviewed.  Constitutional:      Appearance: He is well-developed.  HENT:     Head:  Normocephalic.     Mouth/Throat:     Mouth: Mucous membranes are moist.  Eyes:     Extraocular Movements: Extraocular movements intact.     Conjunctiva/sclera: Conjunctivae normal.  Neck:     Musculoskeletal: Neck supple.  Cardiovascular:     Rate and Rhythm: Normal rate.  Pulmonary:     Effort: Pulmonary effort is normal.  Musculoskeletal:     Left hand: He exhibits tenderness, decreased capillary refill and laceration. He exhibits normal range of motion and no swelling. Normal sensation noted. Normal strength noted. He exhibits no thumb/finger opposition.     Comments: Laceration to the tip of the left ring finger.   Skin:    General: Skin is warm and dry.  Neurological:     General: No focal deficit present.     Mental Status: He is alert and oriented to person, place, and time.  Psychiatric:        Mood and Affect: Mood normal.      ED Treatments / Results  Labs (all labs ordered are listed, but only abnormal results are displayed) Labs Reviewed - No data to display  Radiology Dg Finger Ring Left  Result Date: 02/22/2018 CLINICAL DATA:  43 y/o  M; laceration the left ring  finger. EXAM: LEFT RING FINGER 2+V COMPARISON:  None. FINDINGS: There is no evidence of fracture or dislocation. There is no evidence of arthropathy or other focal bone abnormality. No radiopaque foreign body identified. IMPRESSION: Negative. Electronically Signed   By: Mitzi HansenLance  Furusawa-Stratton M.D.   On: 02/22/2018 17:45    Procedures .Marland Kitchen.Laceration Repair Date/Time: 02/22/2018 5:31 PM Performed by: Janne NapoleonNeese, Kairen Hallinan M, NP Authorized by: Janne NapoleonNeese, Hendrick Pavich M, NP   Consent:    Consent obtained:  Verbal   Consent given by:  Patient   Risks discussed:  Infection and poor wound healing   Alternatives discussed:  No treatment Anesthesia (see MAR for exact dosages):    Anesthesia method:  Local infiltration   Local anesthetic:  Lidocaine 1% WITH epi Laceration details:    Location:  Finger   Finger location:  L  ring finger Repair type:    Repair type:  Simple Pre-procedure details:    Preparation:  Patient was prepped and draped in usual sterile fashion and imaging obtained to evaluate for foreign bodies Exploration:    Hemostasis achieved with:  Direct pressure   Wound exploration: entire depth of wound probed and visualized     Wound extent: no foreign bodies/material noted, no nerve damage noted, no tendon damage noted and no underlying fracture noted     Contaminated: no   Treatment:    Area cleansed with:  Betadine   Amount of cleaning:  Standard   Irrigation solution:  Sterile water   Irrigation method:  Syringe Skin repair:    Repair method:  Sutures   Suture material:  Fast-absorbing gut   Suture technique:  Simple interrupted   Number of sutures:  4 Approximation:    Approximation:  Close Post-procedure details:    Dressing:  Adhesive bandage and splint for protection   Patient tolerance of procedure:  Tolerated well, no immediate complications Comments:     Tetanus updated   (including critical care time)  Medications Ordered in ED Medications  lidocaine (PF) (XYLOCAINE) 1 % injection 5 mL (5 mLs Infiltration Given 02/22/18 1719)  Tdap (BOOSTRIX) injection 0.5 mL (0.5 mLs Intramuscular Given 02/22/18 1707)     Initial Impression / Assessment and Plan / ED Course  I have reviewed the triage vital signs and the nursing notes. 43 y.o. male here with laceration to the left ring finger after dropping an alternator on his finger stable for d/c without fracture or dislocation noted on x-ray. Discussed with the patient plan of care and he voices understanding and agrees with plan.   Final Clinical Impressions(s) / ED Diagnoses   Final diagnoses:  Laceration of left ring finger without foreign body without damage to nail, initial encounter  Contusion of finger of left hand, initial encounter    ED Discharge Orders    None       Kerrie Buffaloeese, Davy Westmoreland RembertM, TexasNP 02/22/18 1754    Mancel BaleWentz,  [place], MD 02/23/18 [phone]

## 2018-02-22 NOTE — ED Notes (Signed)
X-ray at bedside

## 2018-02-22 NOTE — Discharge Instructions (Signed)
The sutures will absorb. Take tylenol and ibuprofen as needed for pain.

## 2020-04-11 ENCOUNTER — Emergency Department (HOSPITAL_COMMUNITY)
Admission: EM | Admit: 2020-04-11 | Discharge: 2020-04-11 | Disposition: A | Discharge status: Home / Self Care | Payer: Self-pay | Attending: Emergency Medicine | Admitting: Emergency Medicine

## 2020-04-11 ENCOUNTER — Encounter (HOSPITAL_COMMUNITY): Payer: Self-pay | Admitting: Emergency Medicine

## 2020-04-11 ENCOUNTER — Emergency Department (HOSPITAL_COMMUNITY): Payer: Self-pay

## 2020-04-11 DIAGNOSIS — F17200 Nicotine dependence, unspecified, uncomplicated: Secondary | ICD-10-CM | POA: Insufficient documentation

## 2020-04-11 DIAGNOSIS — G43109 Migraine with aura, not intractable, without status migrainosus: Secondary | ICD-10-CM | POA: Insufficient documentation

## 2020-04-11 DIAGNOSIS — I10 Essential (primary) hypertension: Secondary | ICD-10-CM | POA: Insufficient documentation

## 2020-04-11 LAB — CBC WITH DIFFERENTIAL/PLATELET
Abs Immature Granulocytes: 0.03 10*3/uL (ref 0.00–0.07)
Basophils Absolute: 0.1 10*3/uL (ref 0.0–0.1)
Basophils Relative: 1 %
Eosinophils Absolute: 0.1 10*3/uL (ref 0.0–0.5)
Eosinophils Relative: 1 %
HCT: 43.6 % (ref 39.0–52.0)
Hemoglobin: 14.5 g/dL (ref 13.0–17.0)
Immature Granulocytes: 0 %
Lymphocytes Relative: 20 %
Lymphs Abs: 1.9 10*3/uL (ref 0.7–4.0)
MCH: 32.4 pg (ref 26.0–34.0)
MCHC: 33.3 g/dL (ref 30.0–36.0)
MCV: 97.3 fL (ref 80.0–100.0)
Monocytes Absolute: 0.5 10*3/uL (ref 0.1–1.0)
Monocytes Relative: 6 %
Neutro Abs: 6.7 10*3/uL (ref 1.7–7.7)
Neutrophils Relative %: 72 %
Platelets: 150 10*3/uL (ref 150–400)
RBC: 4.48 MIL/uL (ref 4.22–5.81)
RDW: 12.3 % (ref 11.5–15.5)
WBC: 9.3 10*3/uL (ref 4.0–10.5)
nRBC: 0 % (ref 0.0–0.2)

## 2020-04-11 LAB — BASIC METABOLIC PANEL
Anion gap: 8 (ref 5–15)
BUN: 9 mg/dL (ref 6–20)
CO2: 21 mmol/L — ABNORMAL LOW (ref 22–32)
Calcium: 9.1 mg/dL (ref 8.9–10.3)
Chloride: 108 mmol/L (ref 98–111)
Creatinine, Ser: 0.97 mg/dL (ref 0.61–1.24)
GFR, Estimated: >60 mL/min (ref >60)
Glucose, Bld: 91 mg/dL (ref 70–99)
Potassium: 4.2 mmol/L (ref 3.5–5.1)
Sodium: 137 mmol/L (ref 135–145)

## 2020-04-11 MED ORDER — SODIUM CHLORIDE 0.9 % IV BOLUS
500.0000 mL | Freq: Once | INTRAVENOUS | Status: AC
  Administered 2020-04-11: 500 mL via INTRAVENOUS

## 2020-04-11 MED ORDER — KETOROLAC TROMETHAMINE 30 MG/ML IJ SOLN
30.0000 mg | Freq: Once | INTRAMUSCULAR | Status: AC
  Administered 2020-04-11: 30 mg via INTRAVENOUS
  Filled 2020-04-11: qty 1

## 2020-04-11 MED ORDER — DIPHENHYDRAMINE HCL 50 MG/ML IJ SOLN
12.5000 mg | Freq: Once | INTRAMUSCULAR | Status: AC
  Administered 2020-04-11: 12.5 mg via INTRAVENOUS
  Filled 2020-04-11: qty 1

## 2020-04-11 MED ORDER — METOCLOPRAMIDE HCL 5 MG/ML IJ SOLN
10.0000 mg | Freq: Once | INTRAMUSCULAR | Status: AC
  Administered 2020-04-11: 10 mg via INTRAVENOUS
  Filled 2020-04-11: qty 2

## 2020-04-11 MED ORDER — IOHEXOL 350 MG/ML SOLN
75.0000 mL | Freq: Once | INTRAVENOUS | Status: AC | PRN
  Administered 2020-04-11: 75 mL via INTRAVENOUS

## 2020-04-11 MED ORDER — DEXAMETHASONE SODIUM PHOSPHATE 10 MG/ML IJ SOLN
8.0000 mg | Freq: Once | INTRAMUSCULAR | Status: AC
  Administered 2020-04-11: 8 mg via INTRAVENOUS
  Filled 2020-04-11: qty 1
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
## 2020-04-11 NOTE — ED Triage Notes (Signed)
C/o headache/ "pressure behind L eye" since yesterday.  Denies any other symptoms.

## 2020-04-11 NOTE — Discharge Instructions (Addendum)
You should get a phone call in 1-2 business days about an appointment with neurology for your complex migraines.  If you don't hear from them by Wednesday, call the number above to ask for an appointment in their migraine clinic.

## 2020-04-13 ENCOUNTER — Encounter: Payer: Self-pay | Admitting: Neurology
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
## 2020-06-17 ENCOUNTER — Ambulatory Visit: Payer: Self-pay | Admitting: Neurology

## 2021-06-01 IMAGING — CT CT ANGIO NECK
1 of 11 series · 5 of 33 positions shown · IV contrast (omnipaque)
Comparison: None.

CLINICAL DATA: Dizziness, worsening headache, vision changes

EXAM:
CT ANGIOGRAPHY HEAD AND NECK
TECHNIQUE: Multidetector CT imaging of the head and neck was performed using
the standard protocol during bolus administration of intravenous
contrast. Multiplanar CT image reconstructions and MIPs were
obtained to evaluate the vascular anatomy. Carotid stenosis
measurements (when applicable) are obtained utilizing NASCET
criteria, using the distal internal carotid diameter as the
denominator.
CONTRAST:  75mL OMNIPAQUE IOHEXOL 350 MG/ML SOLN

[Series 11: ax thins · axial · 0.39mm/px · z∈[+1152,+1392]mm · 5 of 361 slices shown]
[im 61/361  soft-tissue]
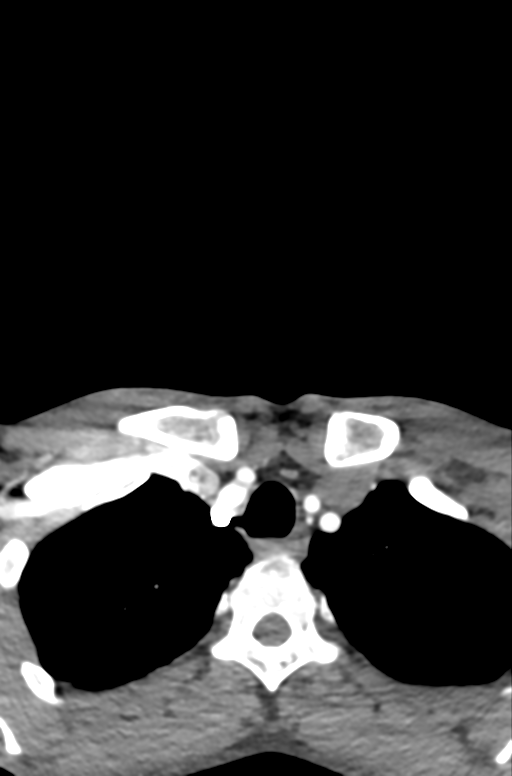
[im 121/361  bone]
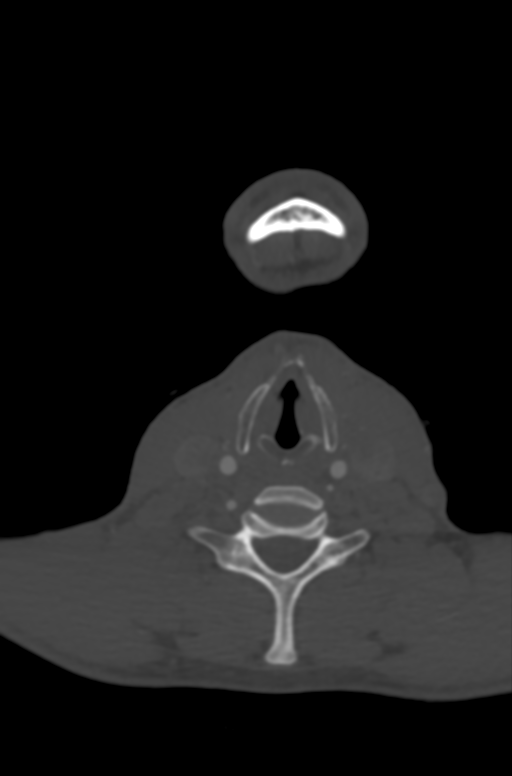
[im 181/361  soft-tissue]
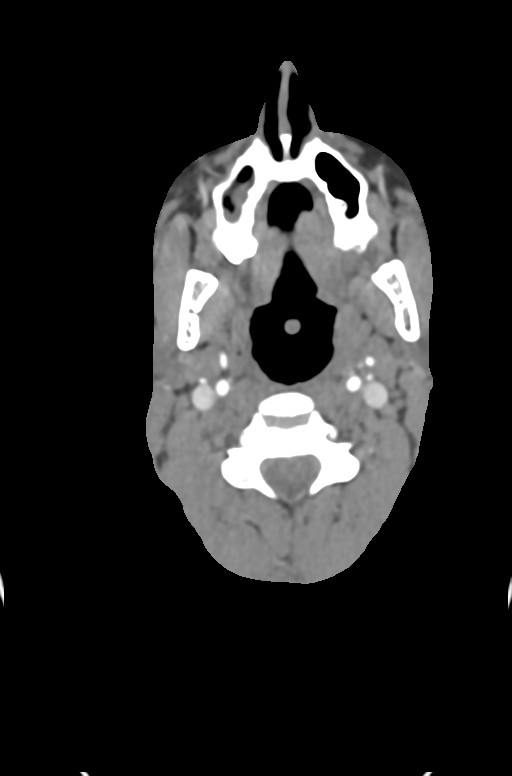
[im 241/361  bone]
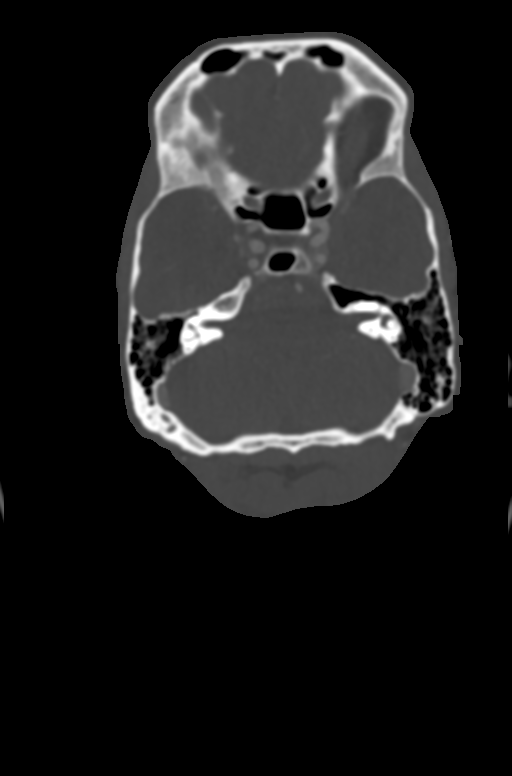
[im 301/361  soft-tissue]
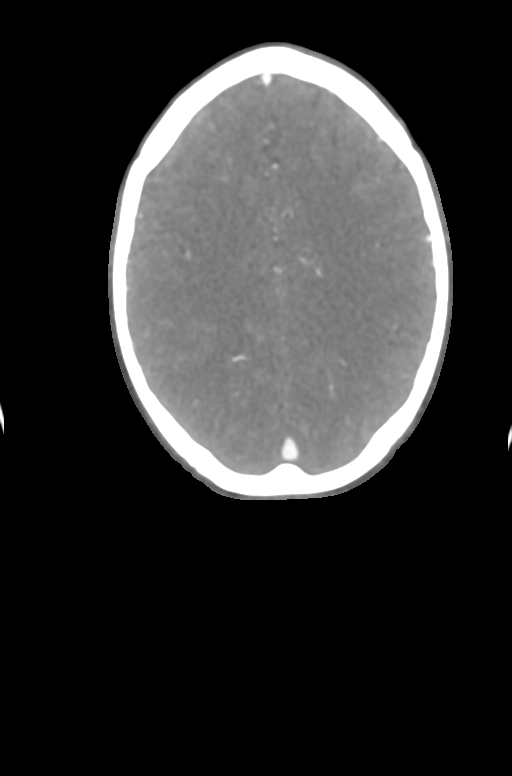

[5 of 33 positions shown; findings below may reference images not displayed]

FINDINGS: CT HEAD

Brain: There is no acute intracranial hemorrhage, mass effect, or
edema. Gray-white differentiation is preserved. There is no
extra-axial fluid collection. Ventricles and sulci are within normal
limits in size and configuration.

Vascular: No hyperdense vessel.

Skull: Calvarium is unremarkable.

Sinuses/Orbits: No acute finding.

Other: None.

Review of the MIP images confirms the above findings

CTA NECK

Aortic arch: Great vessel origins are patent. There is direct origin
of the left vertebral artery from the arch.

Right carotid system: Patent.  Stenosis or evidence of dissection.

Left carotid system: Patent.  No stenosis or evidence of dissection.

Vertebral arteries: Patent.  Right vertebral artery is dominant.

Skeleton: Mild degenerative changes primarily at C3-C4 and C4-C5.

Other neck: Multifocal tooth decay.  No neck mass or adenopathy.

Upper chest: Included upper lungs are clear. Mild paraseptal
emphysema at the apices.

Review of the MIP images confirms the above findings

CTA HEAD

Anterior circulation: Intracranial internal carotid arteries are
patent. Anterior and middle cerebral arteries are patent.

Posterior circulation: Intracranial vertebral arteries, basilar
artery, and posterior cerebral arteries are patent.

Venous sinuses: Patent as allowed by contrast bolus timing.

Review of the MIP images confirms the above findings
IMPRESSION: No acute intracranial abnormality.

No large vessel occlusion, hemodynamically significant stenosis, or
evidence of dissection.

## 2021-06-01 IMAGING — CT CT ANGIO HEAD
1 of 11 series · 5 of 33 positions shown · IV contrast (APPLIED)
Comparison: None.

CLINICAL DATA: Dizziness, worsening headache, vision changes

EXAM:
CT ANGIOGRAPHY HEAD AND NECK
TECHNIQUE: Multidetector CT imaging of the head and neck was performed using
the standard protocol during bolus administration of intravenous
contrast. Multiplanar CT image reconstructions and MIPs were
obtained to evaluate the vascular anatomy. Carotid stenosis
measurements (when applicable) are obtained utilizing NASCET
criteria, using the distal internal carotid diameter as the
denominator.
CONTRAST:  75mL OMNIPAQUE IOHEXOL 350 MG/ML SOLN

[Series 11: ax thins · axial · 0.39mm/px · z∈[+1152,+1392]mm · 5 of 361 slices shown]
[im 61/361  soft-tissue]
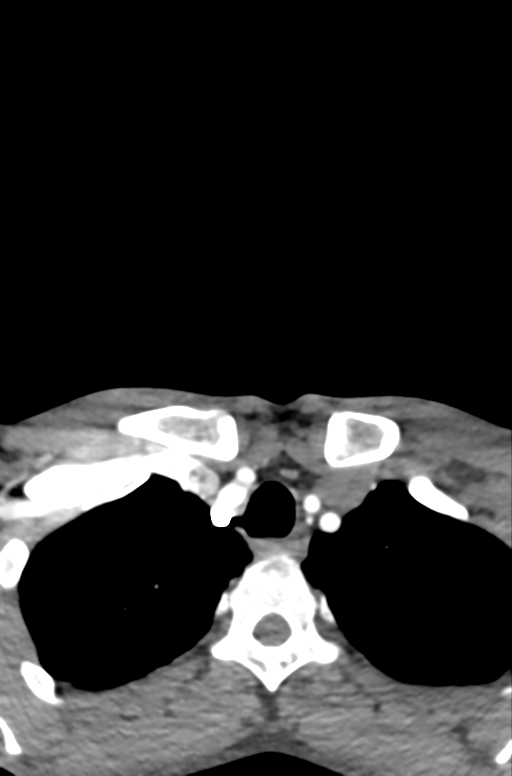
[im 121/361  bone]
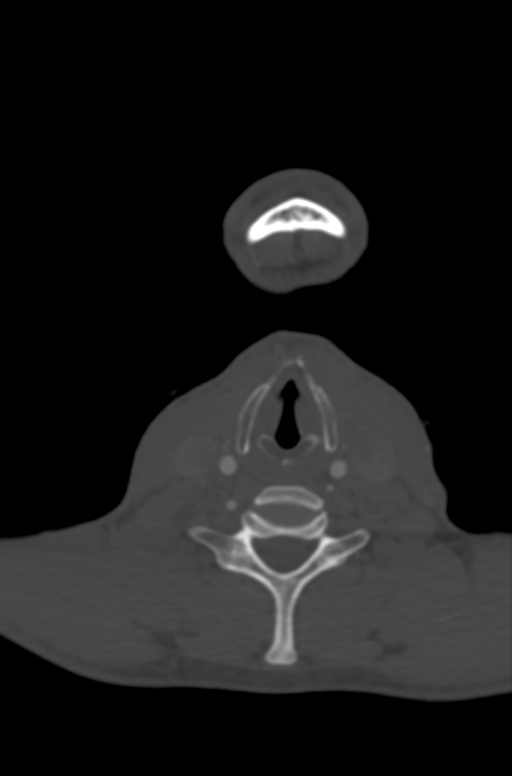
[im 181/361  soft-tissue]
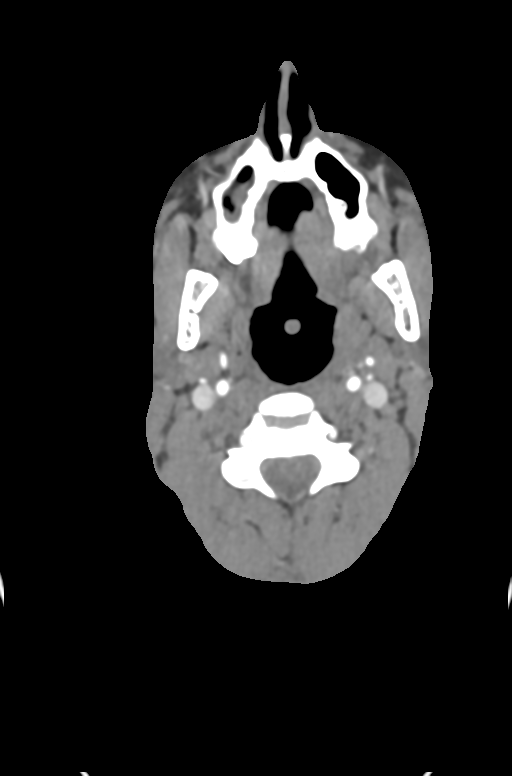
[im 241/361  bone]
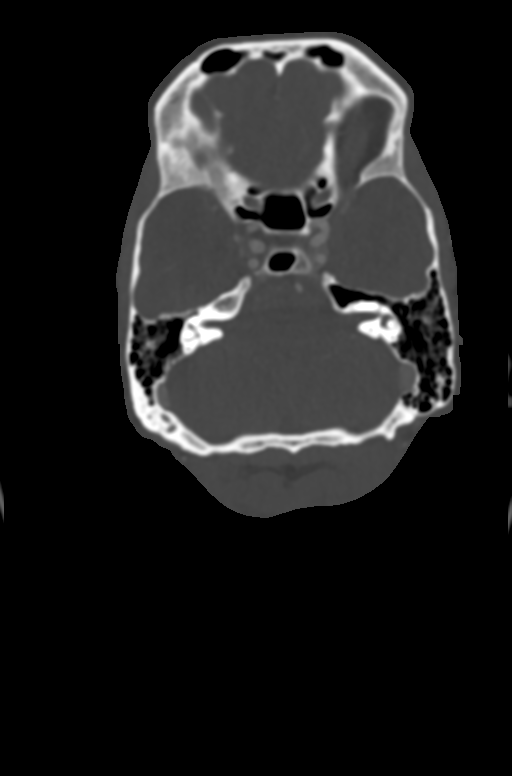
[im 301/361  soft-tissue]
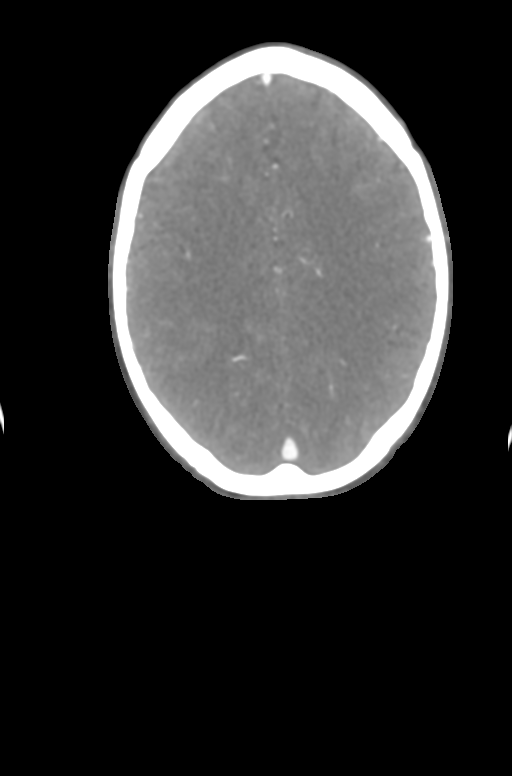

[5 of 33 positions shown; findings below may reference images not displayed]

FINDINGS: CT HEAD

Brain: There is no acute intracranial hemorrhage, mass effect, or
edema. Gray-white differentiation is preserved. There is no
extra-axial fluid collection. Ventricles and sulci are within normal
limits in size and configuration.

Vascular: No hyperdense vessel.

Skull: Calvarium is unremarkable.

Sinuses/Orbits: No acute finding.

Other: None.

Review of the MIP images confirms the above findings

CTA NECK

Aortic arch: Great vessel origins are patent. There is direct origin
of the left vertebral artery from the arch.

Right carotid system: Patent.  Stenosis or evidence of dissection.

Left carotid system: Patent.  No stenosis or evidence of dissection.

Vertebral arteries: Patent.  Right vertebral artery is dominant.

Skeleton: Mild degenerative changes primarily at C3-C4 and C4-C5.

Other neck: Multifocal tooth decay.  No neck mass or adenopathy.

Upper chest: Included upper lungs are clear. Mild paraseptal
emphysema at the apices.

Review of the MIP images confirms the above findings

CTA HEAD

Anterior circulation: Intracranial internal carotid arteries are
patent. Anterior and middle cerebral arteries are patent.

Posterior circulation: Intracranial vertebral arteries, basilar
artery, and posterior cerebral arteries are patent.

Venous sinuses: Patent as allowed by contrast bolus timing.

Review of the MIP images confirms the above findings
IMPRESSION: No acute intracranial abnormality.

No large vessel occlusion, hemodynamically significant stenosis, or
evidence of dissection.

## 2022-01-28 ENCOUNTER — Other Ambulatory Visit: Payer: Self-pay

## 2022-01-28 ENCOUNTER — Emergency Department (HOSPITAL_COMMUNITY)
Admission: EM | Admit: 2022-01-28 | Discharge: 2022-01-29 | Discharge status: Against Medical Advice | Payer: Self-pay | Attending: Emergency Medicine | Admitting: Emergency Medicine

## 2022-01-28 DIAGNOSIS — Z5321 Procedure and treatment not carried out due to patient leaving prior to being seen by health care provider: Secondary | ICD-10-CM | POA: Insufficient documentation

## 2022-01-28 DIAGNOSIS — R519 Headache, unspecified: Secondary | ICD-10-CM | POA: Insufficient documentation

## 2022-01-28 DIAGNOSIS — R112 Nausea with vomiting, unspecified: Secondary | ICD-10-CM | POA: Insufficient documentation

## 2022-01-28 LAB — COMPREHENSIVE METABOLIC PANEL
ALT: 27 U/L (ref 0–44)
AST: 29 U/L (ref 15–41)
Albumin: 5.7 g/dL — ABNORMAL HIGH (ref 3.5–5.0)
Alkaline Phosphatase: 91 U/L (ref 38–126)
Anion gap: 15 (ref 5–15)
BUN: 20 mg/dL (ref 6–20)
CO2: 23 mmol/L (ref 22–32)
Calcium: 11.1 mg/dL — ABNORMAL HIGH (ref 8.9–10.3)
Chloride: 101 mmol/L (ref 98–111)
Creatinine, Ser: 2.23 mg/dL — ABNORMAL HIGH (ref 0.61–1.24)
GFR, Estimated: 36 mL/min — ABNORMAL LOW (ref >60)
Glucose, Bld: 155 mg/dL — ABNORMAL HIGH (ref 70–99)
Potassium: 5 mmol/L (ref 3.5–5.1)
Sodium: 139 mmol/L (ref 135–145)
Total Bilirubin: 0.7 mg/dL (ref 0.3–1.2)
Total Protein: 9 g/dL — ABNORMAL HIGH (ref 6.5–8.1)

## 2022-01-28 LAB — CBC WITH DIFFERENTIAL/PLATELET
Abs Immature Granulocytes: 0.12 10*3/uL — ABNORMAL HIGH (ref 0.00–0.07)
Basophils Absolute: 0.1 10*3/uL (ref 0.0–0.1)
Basophils Relative: 0 %
Eosinophils Absolute: 0 10*3/uL (ref 0.0–0.5)
Eosinophils Relative: 0 %
HCT: 54.3 % — ABNORMAL HIGH (ref 39.0–52.0)
Hemoglobin: 19 g/dL — ABNORMAL HIGH (ref 13.0–17.0)
Immature Granulocytes: 1 %
Lymphocytes Relative: 12 %
Lymphs Abs: 2.1 10*3/uL (ref 0.7–4.0)
MCH: 32.4 pg (ref 26.0–34.0)
MCHC: 35 g/dL (ref 30.0–36.0)
MCV: 92.5 fL (ref 80.0–100.0)
Monocytes Absolute: 1.2 10*3/uL — ABNORMAL HIGH (ref 0.1–1.0)
Monocytes Relative: 6 %
Neutro Abs: 15 10*3/uL — ABNORMAL HIGH (ref 1.7–7.7)
Neutrophils Relative %: 81 %
Platelets: 270 10*3/uL (ref 150–400)
RBC: 5.87 MIL/uL — ABNORMAL HIGH (ref 4.22–5.81)
RDW: 12.4 % (ref 11.5–15.5)
WBC: 18.5 10*3/uL — ABNORMAL HIGH (ref 4.0–10.5)
nRBC: 0 % (ref 0.0–0.2)

## 2022-01-28 NOTE — ED Triage Notes (Signed)
Pt arrives with c/o headache that started about 3 day ago. Pt has hx of migraines. Pt endorses n/v.

## 2022-01-29 ENCOUNTER — Encounter (HOSPITAL_COMMUNITY): Payer: Self-pay

## 2022-01-29 ENCOUNTER — Other Ambulatory Visit: Payer: Self-pay

## 2022-01-29 ENCOUNTER — Emergency Department (HOSPITAL_COMMUNITY)
Admission: EM | Admit: 2022-01-29 | Discharge: 2022-01-29 | Disposition: A | Discharge status: Home / Self Care | Payer: Self-pay | Attending: Emergency Medicine | Admitting: Emergency Medicine

## 2022-01-29 DIAGNOSIS — R7989 Other specified abnormal findings of blood chemistry: Secondary | ICD-10-CM | POA: Insufficient documentation

## 2022-01-29 DIAGNOSIS — D72829 Elevated white blood cell count, unspecified: Secondary | ICD-10-CM | POA: Insufficient documentation

## 2022-01-29 DIAGNOSIS — G43009 Migraine without aura, not intractable, without status migrainosus: Secondary | ICD-10-CM | POA: Insufficient documentation

## 2022-01-29 DIAGNOSIS — I10 Essential (primary) hypertension: Secondary | ICD-10-CM | POA: Insufficient documentation

## 2022-01-29 DIAGNOSIS — Z79899 Other long term (current) drug therapy: Secondary | ICD-10-CM | POA: Insufficient documentation

## 2022-01-29 LAB — BASIC METABOLIC PANEL
Anion gap: 20 — ABNORMAL HIGH (ref 5–15)
BUN: 33 mg/dL — ABNORMAL HIGH (ref 6–20)
CO2: 16 mmol/L — ABNORMAL LOW (ref 22–32)
Calcium: 10.2 mg/dL (ref 8.9–10.3)
Chloride: 95 mmol/L — ABNORMAL LOW (ref 98–111)
Creatinine, Ser: 3.3 mg/dL — ABNORMAL HIGH (ref 0.61–1.24)
GFR, Estimated: 23 mL/min — ABNORMAL LOW (ref >60)
Glucose, Bld: 127 mg/dL — ABNORMAL HIGH (ref 70–99)
Potassium: 4.6 mmol/L (ref 3.5–5.1)
Sodium: 131 mmol/L — ABNORMAL LOW (ref 135–145)

## 2022-01-29 LAB — CBC
HCT: 53.3 % — ABNORMAL HIGH (ref 39.0–52.0)
Hemoglobin: 18.4 g/dL — ABNORMAL HIGH (ref 13.0–17.0)
MCH: 32.1 pg (ref 26.0–34.0)
MCHC: 34.5 g/dL (ref 30.0–36.0)
MCV: 93 fL (ref 80.0–100.0)
Platelets: 244 10*3/uL (ref 150–400)
RBC: 5.73 MIL/uL (ref 4.22–5.81)
RDW: 12.6 % (ref 11.5–15.5)
WBC: 18.9 10*3/uL — ABNORMAL HIGH (ref 4.0–10.5)
nRBC: 0 % (ref 0.0–0.2)

## 2022-01-29 MED ORDER — PROCHLORPERAZINE EDISYLATE 10 MG/2ML IJ SOLN
10.0000 mg | INTRAMUSCULAR | Status: AC
  Administered 2022-01-29: 10 mg via INTRAVENOUS
  Filled 2022-01-29: qty 2

## 2022-01-29 MED ORDER — SODIUM CHLORIDE 0.9 % IV BOLUS
1000.0000 mL | Freq: Once | INTRAVENOUS | Status: AC
  Administered 2022-01-29: 1000 mL via INTRAVENOUS

## 2022-01-29 MED ORDER — ONDANSETRON HCL 4 MG PO TABS: 4.0000 mg | ORAL_TABLET | Freq: Four times a day (QID) | ORAL | 0 refills | Status: DC

## 2022-01-29 MED ORDER — DEXAMETHASONE SODIUM PHOSPHATE 10 MG/ML IJ SOLN
10.0000 mg | Freq: Once | INTRAMUSCULAR | Status: AC
  Administered 2022-01-29: 10 mg via INTRAVENOUS
  Filled 2022-01-29: qty 1

## 2022-01-29 MED ORDER — KETOROLAC TROMETHAMINE 15 MG/ML IJ SOLN
15.0000 mg | Freq: Once | INTRAMUSCULAR | Status: AC
  Administered 2022-01-29: 15 mg via INTRAVENOUS
  Filled 2022-01-29: qty 1

## 2022-01-29 MED ORDER — DIPHENHYDRAMINE HCL 50 MG/ML IJ SOLN
25.0000 mg | Freq: Once | INTRAMUSCULAR | Status: AC
  Administered 2022-01-29: 25 mg via INTRAVENOUS
  Filled 2022-01-29: qty 1

## 2022-01-29 NOTE — ED Provider Notes (Signed)
[name] Note   CSN: [id number] Arrival date & time: 01/29/22  0900     History  Chief Complaint  Patient presents with   Migraine    Harold Castillo is a 47 y.o. male with medical history of hypertension, migraines.  Patient presents to ED for evaluation of migraine.  Patient reports that for the last 3 days he has had waxing and waning migraine.  Patient denies any new features to this migraine, states that this is a standard migraine for him.  Patient states that he cannot afford abortive medication such as sumatriptan and so he has only been taking over-the-counter medications such as Tylenol and ibuprofen which have not been relieving symptoms.  Patient endorsing nausea and vomiting however denies any one-sided weakness or numbness, chest pain, shortness of breath, syncope.  Patient reports that migraine came on gradually, denies any thunderclap presentation.   Migraine Associated symptoms include headaches. Pertinent negatives include no chest pain and no shortness of breath.       Home Medications Prior to Admission medications   Medication Sig Start Date End Date Taking? Authorizing Provider  ibuprofen (ADVIL,MOTRIN) 200 MG tablet Take 400 mg by mouth every 6 (six) hours as needed for headache, mild pain or moderate pain.    [provider]  vitamin B-12 (CYANOCOBALAMIN) 1000 MCG tablet Take 1,000 mcg by mouth daily.    [provider]      Allergies    Patient has no known allergies.    Review of Systems   Review of Systems  Respiratory:  Negative for shortness of breath.   Cardiovascular:  Negative for chest pain.  Gastrointestinal:  Positive for nausea and vomiting. Negative for diarrhea.  Neurological:  Positive for headaches. Negative for syncope, weakness and numbness.  All other systems reviewed and are negative.   Physical Exam Updated Vital Signs BP 131/89   Pulse 70   Temp 98.1 F  (36.7 C) (Oral)   Resp 16   Ht 5\' 11"  (1.803 m)   Wt 68 kg   SpO2 98%   BMI 20.92 kg/m  Physical Exam Vitals and nursing note reviewed.  Constitutional:      General: He is not in acute distress.    Appearance: Normal appearance. He is not ill-appearing, toxic-appearing or diaphoretic.  HENT:     Head: Normocephalic and atraumatic.     Nose: Nose normal. No congestion.     Mouth/Throat:     Mouth: Mucous membranes are moist.     Pharynx: Oropharynx is clear.  Eyes:     Extraocular Movements: Extraocular movements intact.     Conjunctiva/sclera: Conjunctivae normal.     Pupils: Pupils are equal, round, and reactive to light.  Cardiovascular:     Rate and Rhythm: Normal rate and regular rhythm.  Pulmonary:     Effort: Pulmonary effort is normal.     Breath sounds: Normal breath sounds. No wheezing.  Abdominal:     General: Abdomen is flat. Bowel sounds are normal.     Palpations: Abdomen is soft.     Tenderness: There is no abdominal tenderness.  Musculoskeletal:     Cervical back: Normal range of motion and neck supple.  Skin:    General: Skin is warm and dry.     Capillary Refill: Capillary refill takes less than 2 seconds.  Neurological:     General: No focal deficit present.     Mental Status: He is alert  and oriented to person, place, and time.     GCS: GCS eye subscore is 4. GCS verbal subscore is 5. GCS motor subscore is 6.     Cranial Nerves: Cranial nerves 2-12 are intact. No cranial nerve deficit.     Sensory: Sensation is intact. No sensory deficit.     Motor: Motor function is intact. No weakness.     Coordination: Coordination is intact. Heel to [facility] Test normal.     ED Results / Procedures / Treatments   Labs (all labs ordered are listed, but only abnormal results are displayed) Labs Reviewed  CBC - Abnormal; Notable for the following components:      Result Value   WBC 18.9 (*)    Hemoglobin 18.4 (*)    HCT 53.3 (*)    All other components within  normal limits  BASIC METABOLIC PANEL - Abnormal; Notable for the following components:   Sodium 131 (*)    Chloride 95 (*)    CO2 16 (*)    Glucose, Bld 127 (*)    BUN 33 (*)    Creatinine, Ser 3.30 (*)    GFR, Estimated 23 (*)    Anion gap 20 (*)    All other components within normal limits    EKG None  Radiology No results found.  Procedures Procedures   Medications Ordered in ED Medications  prochlorperazine (COMPAZINE) injection 10 mg (10 mg Intravenous Given 01/29/22 1154)  diphenhydrAMINE (BENADRYL) injection 25 mg (25 mg Intravenous Given 01/29/22 1155)  ketorolac (TORADOL) 15 MG/ML injection 15 mg (15 mg Intravenous Given 01/29/22 1154)  dexamethasone (DECADRON) injection 10 mg (10 mg Intravenous Given 01/29/22 1154)  sodium chloride 0.9 % bolus 1,000 mL (0 mLs Intravenous Stopped 01/29/22 1257)  sodium chloride 0.9 % bolus 1,000 mL (0 mLs Intravenous Stopped 01/29/22 1400)    ED Course/ Medical Decision Making/ A&P }                          Medical Decision Making Amount and/or Complexity of Data Reviewed Labs: ordered.  Risk Prescription drug management.   47 year old man presents to ED for evaluation.  Please see HPI for further details.  On examination the patient is afebrile and nontachycardic.  The patient sounds are clear bilaterally, he is not hypoxic.  Patient abdomen soft and compressible throughout.  Patient neurological examination shows no focal neurodeficits.  Patient workup to include CBC, BMP to assess for electrolyte derangements as well as kidney function.  Patient initially provided migraine cocktail to include 10 mg Compazine, 50 mg Toradol, 10 mg Decadron, 25 mg Benadryl, 1 L fluid.  Patient CBC with an elevated white blood cell count 18.9 however no fever, not tachycardic.  Unsure source.  Patient BMP with decreased sodium to 131, elevated creatinine at 3.3.  This is indicative of an AKI.  Patient creatinine yesterday was 2.2.  Patient was  provided with 2 L of fluid total.  While here in the department the patient has had no nausea or vomiting.  On reassessment, the patient reports that he feels much better.  Admission consideration was given to this patient however the patient is had no nausea or vomiting while here.  After discussion with my attending, Dr. Melina Copa, we feel that this patient is stable for discharge.  I advised the patient that he will need to continue hydrating himself as an outpatient for the rest of the day and into tomorrow.  I  advised the patient that he will need to return in 2 days for recheck of labs to assess kidney function as well as repeat CBC to assess leukocytosis.  Plan of management discussed with Dr. Charm Barges who voices agreement with plan.  Patient amenable to plan.  The patient was given strict return precautions to include return of nausea or vomiting and he voiced understanding.  The patient had all his questions answered to satisfaction.  The patient is stable for discharge.  Final Clinical Impression(s) / ED Diagnoses Final diagnoses:  Migraine without aura and without status migrainosus, not intractable    Rx / DC Orders ED Discharge Orders     None         Clent Ridges 01/29/22 1428    Terrilee Files, MD 01/29/22 1731

## 2022-01-29 NOTE — ED Triage Notes (Signed)
Reports migraine x 4 days was here yesterday and couldn't wait.  Reports n/v. Reports this is his typical migraine.  Requesting migraine cocktail.

## 2022-01-29 NOTE — Discharge Instructions (Addendum)
Return to the ED with any new or worsening symptoms such as nausea or vomiting. Your creatinine was noted to be high here today.  You will need to continue hydrating yourself as an outpatient.  Please drink 2 bottles of water per hour.  Please return to the ED in 2 days for reassessment of kidney function and white blood cell count. Please read attached guide concerning, migraines Please take the prescribed antinausea medication

## 2022-11-02 ENCOUNTER — Other Ambulatory Visit: Payer: Self-pay

## 2022-11-02 ENCOUNTER — Emergency Department (HOSPITAL_COMMUNITY)
Admission: EM | Admit: 2022-11-02 | Discharge: 2022-11-02 | Disposition: A | Discharge status: Home / Self Care | Payer: Self-pay | Attending: Emergency Medicine | Admitting: Emergency Medicine

## 2022-11-02 ENCOUNTER — Encounter (HOSPITAL_COMMUNITY): Payer: Self-pay | Admitting: Emergency Medicine

## 2022-11-02 DIAGNOSIS — G43001 Migraine without aura, not intractable, with status migrainosus: Secondary | ICD-10-CM | POA: Insufficient documentation

## 2022-11-02 MED ORDER — PROCHLORPERAZINE EDISYLATE 10 MG/2ML IJ SOLN
10.0000 mg | Freq: Once | INTRAMUSCULAR | Status: AC
  Administered 2022-11-02: 10 mg via INTRAVENOUS
  Filled 2022-11-02: qty 2

## 2022-11-02 MED ORDER — KETOROLAC TROMETHAMINE 30 MG/ML IJ SOLN
30.0000 mg | Freq: Once | INTRAMUSCULAR | Status: AC
  Administered 2022-11-02: 30 mg via INTRAVENOUS
  Filled 2022-11-02: qty 1

## 2022-11-02 MED ORDER — DEXAMETHASONE SODIUM PHOSPHATE 10 MG/ML IJ SOLN
10.0000 mg | Freq: Once | INTRAMUSCULAR | Status: AC
  Administered 2022-11-02: 10 mg via INTRAVENOUS
  Filled 2022-11-02: qty 1

## 2022-11-02 NOTE — ED Provider Notes (Signed)
[place] EMERGENCY DEPARTMENT AT [facility] Provider Note   CSN: [id number] Arrival date & time: 11/02/22  1045     History  Chief Complaint  Patient presents with   Headache    Harold Castillo is a 47 y.o. male.  He has a history of migraines.  Complaining of a migraine headache for 3 days.  Generalized throbbing, feeling like his eyes are, fall out of his head associated with some photophobia.  No fevers no recent head injuries.  Ibuprofen not helping.  Symptoms are typical of his headaches.  The history is provided by the patient.  Headache Pain location:  Generalized Quality:  Dull Severity currently:  10/10 Severity at highest:  10/10 Onset quality:  Gradual Duration:  3 days Timing:  Constant Progression:  Unchanged Chronicity:  Recurrent Similar to prior headaches: yes   Relieved by:  Nothing Worsened by:  Light and activity Ineffective treatments:  NSAIDs Associated symptoms: blurred vision, eye pain, facial pain and visual change   Associated symptoms: no abdominal pain, no fever, no focal weakness, no neck pain and no vomiting        Home Medications Prior to Admission medications   Medication Sig Start Date End Date Taking? Authorizing Provider  ibuprofen (ADVIL,MOTRIN) 200 MG tablet Take 400 mg by mouth every 6 (six) hours as needed for headache, mild pain or moderate pain.    [provider]  ondansetron (ZOFRAN) 4 MG tablet Take 1 tablet (4 mg total) by mouth every 6 (six) hours. 01/29/22   Al Decant, PA-C  vitamin B-12 (CYANOCOBALAMIN) 1000 MCG tablet Take 1,000 mcg by mouth daily.    [provider]      Allergies    Patient has no known allergies.    Review of Systems   Review of Systems  Constitutional:  Negative for fever.  Eyes:  Positive for blurred vision, pain and visual disturbance.  Respiratory:  Negative for shortness of breath.   Cardiovascular:  Negative for chest pain.  Gastrointestinal:   Negative for abdominal pain and vomiting.  Musculoskeletal:  Negative for neck pain.  Neurological:  Positive for headaches. Negative for focal weakness.    Physical Exam Updated Vital Signs BP (!) 148/93 (BP Location: Right Arm)   Pulse 93   Temp 98.6 F (37 C) (Oral)   Resp 15   SpO2 100%  Physical Exam Vitals and nursing note reviewed.  Constitutional:      General: He is not in acute distress.    Appearance: He is well-developed.  HENT:     Head: Normocephalic and atraumatic.  Eyes:     Conjunctiva/sclera: Conjunctivae normal.  Cardiovascular:     Rate and Rhythm: Normal rate and regular rhythm.     Heart sounds: No murmur heard. Pulmonary:     Effort: Pulmonary effort is normal. No respiratory distress.     Breath sounds: Normal breath sounds.  Abdominal:     Palpations: Abdomen is soft.     Tenderness: There is no abdominal tenderness.  Musculoskeletal:        General: No swelling.     Cervical back: Neck supple.  Skin:    General: Skin is warm and dry.     Capillary Refill: Capillary refill takes less than 2 seconds.  Neurological:     Mental Status: He is alert.     GCS: GCS eye subscore is 4. GCS verbal subscore is 5. GCS motor subscore is 6.  Cranial Nerves: No cranial nerve deficit.     Sensory: No sensory deficit.     Motor: No weakness.     ED Results / Procedures / Treatments   Labs (all labs ordered are listed, but only abnormal results are displayed) Labs Reviewed - No data to display  EKG None  Radiology No results found.  Procedures Procedures    Medications Ordered in ED Medications  dexamethasone (DECADRON) injection 10 mg (has no administration in time range)  prochlorperazine (COMPAZINE) injection 10 mg (has no administration in time range)  ketorolac (TORADOL) 30 MG/ML injection 30 mg (has no administration in time range)    ED Course/ Medical Decision Making/ A&P Clinical Course as of 11/03/22 1012  Thu Nov 02, 2022   1228 Patient's headache is down to a 4 out of 10 and he is asking to be discharged. [MB]    Clinical Course User Index [MB] Terrilee Files, MD                                 Medical Decision Making Risk Prescription drug management.   This patient complains of general headache; this involves an extensive number of treatment Options and is a complaint that carries with it a high risk of complications and morbidity. The differential includes migraine, headache, bleed, stroke, hypertensive emergency  I ordered medication migraine cocktail and reviewed PMP when indicated. Previous records obtained and reviewed in epic including prior ED visits for similar presentation Cardiac monitoring reviewed, sinus rhythm Social determinants considered, tobacco use Critical Interventions: None  After the interventions stated above, I reevaluated the patient and found patient's headache to be improved Admission and further testing considered, no indications for admission or further workup at this time.  Given contact information for outpatient urology.  Return instructions discussed         Final Clinical Impression(s) / ED Diagnoses Final diagnoses:  Migraine without aura and with status migrainosus, not intractable    Rx / DC Orders ED Discharge Orders     None         Terrilee Files, MD 11/03/22 1013

## 2022-11-02 NOTE — Discharge Instructions (Addendum)
You are seen in the emergency department for a migraine headache.  You received migraine cocktail with improvement in your symptoms.  Please follow-up with neurology as well as recommended during your last visit.

## 2022-11-02 NOTE — ED Triage Notes (Signed)
Pt reports headache x 3 days Pt reports he has hx of migraines. Denies fevers. Tried OTC medication with no relief.

## 2022-11-05 NOTE — Plan of Care (Signed)
[facility] Tonsillectomy/Adenoidectomy, Postoperative PEDS care plan entered in error.

## 2022-11-12 ENCOUNTER — Other Ambulatory Visit: Payer: Self-pay

## 2022-11-12 ENCOUNTER — Emergency Department (HOSPITAL_COMMUNITY)
Admission: EM | Admit: 2022-11-12 | Discharge: 2022-11-12 | Disposition: A | Discharge status: Home / Self Care | Payer: Self-pay | Attending: Emergency Medicine | Admitting: Emergency Medicine

## 2022-11-12 ENCOUNTER — Encounter (HOSPITAL_COMMUNITY): Payer: Self-pay | Admitting: *Deleted

## 2022-11-12 ENCOUNTER — Emergency Department (HOSPITAL_COMMUNITY): Payer: Self-pay

## 2022-11-12 DIAGNOSIS — G43809 Other migraine, not intractable, without status migrainosus: Secondary | ICD-10-CM | POA: Insufficient documentation

## 2022-11-12 LAB — CBC WITH DIFFERENTIAL/PLATELET
Abs Immature Granulocytes: 0 10*3/uL (ref 0.00–0.07)
Basophils Absolute: 0 10*3/uL (ref 0.0–0.1)
Basophils Relative: 0 %
Eosinophils Absolute: 0.1 10*3/uL (ref 0.0–0.5)
Eosinophils Relative: 1 %
HCT: 40.3 % (ref 39.0–52.0)
Hemoglobin: 13.6 g/dL (ref 13.0–17.0)
Lymphocytes Relative: 13 %
Lymphs Abs: 1.6 10*3/uL (ref 0.7–4.0)
MCH: 31.8 pg (ref 26.0–34.0)
MCHC: 33.7 g/dL (ref 30.0–36.0)
MCV: 94.2 fL (ref 80.0–100.0)
Monocytes Absolute: 0.4 10*3/uL (ref 0.1–1.0)
Monocytes Relative: 3 %
Neutro Abs: 10.4 10*3/uL — ABNORMAL HIGH (ref 1.7–7.7)
Neutrophils Relative %: 83 %
Platelets: 259 10*3/uL (ref 150–400)
RBC: 4.28 MIL/uL (ref 4.22–5.81)
RDW: 12.7 % (ref 11.5–15.5)
WBC: 12.5 10*3/uL — ABNORMAL HIGH (ref 4.0–10.5)
nRBC: 0 % (ref 0.0–0.2)
nRBC: 0 /100{WBCs}

## 2022-11-12 LAB — COOXEMETRY PANEL
Carboxyhemoglobin: 2.9 % — ABNORMAL HIGH (ref 0.5–1.5)
Methemoglobin: 0.7 % (ref 0.0–1.5)
O2 Saturation: 57.4 %
Total hemoglobin: 13.2 g/dL (ref 12.0–16.0)

## 2022-11-12 LAB — BASIC METABOLIC PANEL
Anion gap: 13 (ref 5–15)
BUN: 11 mg/dL (ref 6–20)
CO2: 21 mmol/L — ABNORMAL LOW (ref 22–32)
Calcium: 8.5 mg/dL — ABNORMAL LOW (ref 8.9–10.3)
Chloride: 101 mmol/L (ref 98–111)
Creatinine, Ser: 0.95 mg/dL (ref 0.61–1.24)
GFR, Estimated: >60 mL/min (ref >60)
Glucose, Bld: 98 mg/dL (ref 70–99)
Potassium: 4.2 mmol/L (ref 3.5–5.1)
Sodium: 135 mmol/L (ref 135–145)

## 2022-11-12 MED ORDER — METOCLOPRAMIDE HCL 5 MG/ML IJ SOLN
10.0000 mg | Freq: Once | INTRAMUSCULAR | Status: AC
  Administered 2022-11-12: 10 mg via INTRAVENOUS
  Filled 2022-11-12: qty 2

## 2022-11-12 MED ORDER — SODIUM CHLORIDE 0.9 % IV BOLUS
1000.0000 mL | Freq: Once | INTRAVENOUS | Status: AC
  Administered 2022-11-12: 1000 mL via INTRAVENOUS

## 2022-11-12 MED ORDER — SODIUM CHLORIDE 0.9 % IV SOLN
25.0000 mg | Freq: Once | INTRAVENOUS | Status: AC
  Administered 2022-11-12: 25 mg via INTRAVENOUS
  Filled 2022-11-12: qty 1

## 2022-11-12 MED ORDER — DEXAMETHASONE SODIUM PHOSPHATE 10 MG/ML IJ SOLN
10.0000 mg | Freq: Once | INTRAMUSCULAR | Status: AC
  Administered 2022-11-12: 10 mg via INTRAVENOUS
  Filled 2022-11-12: qty 1

## 2022-11-12 MED ORDER — DIPHENHYDRAMINE HCL 50 MG/ML IJ SOLN
25.0000 mg | Freq: Once | INTRAMUSCULAR | Status: AC
  Administered 2022-11-12: 25 mg via INTRAVENOUS
  Filled 2022-11-12: qty 1

## 2022-11-12 NOTE — ED Provider Notes (Signed)
EMERGENCY DEPARTMENT AT [facility] Provider Note   CSN: [id number] Arrival date & time: 11/12/22  1607     History  Chief Complaint  Patient presents with   Headache    Harold Castillo is a 47 y.o. male homeless here with headache.  Patient has been having headaches for the last 2 weeks.  Patient was seen here about 10 days ago and received migraine cocktail.  He states that the pain minimally improved.  Patient states that the headache is frontal.  He is homeless and living his car.  He states that he has trouble sleeping due to the headaches.  Patient states that still has some blurry vision.  Patient also has some right ear pain and has been using Debrox and Q-tips with minimal relief  The history is provided by the patient.       Home Medications Prior to Admission medications   Medication Sig Start Date End Date Taking? Authorizing Provider  ibuprofen (ADVIL,MOTRIN) 200 MG tablet Take 400 mg by mouth every 6 (six) hours as needed for headache, mild pain or moderate pain.    [provider]  ondansetron (ZOFRAN) 4 MG tablet Take 1 tablet (4 mg total) by mouth every 6 (six) hours. 01/29/22   Al Decant, PA-C  vitamin B-12 (CYANOCOBALAMIN) 1000 MCG tablet Take 1,000 mcg by mouth daily.    [provider]      Allergies    Patient has no known allergies.    Review of Systems   Review of Systems  Neurological:  Positive for headaches.  All other systems reviewed and are negative.   Physical Exam Updated Vital Signs BP (!) 129/95   Pulse (!) 101   Temp 98.5 F (36.9 C)   Resp 18   Ht 5\' 11"  (1.803 m)   Wt 68 kg   SpO2 95%   BMI 20.91 kg/m  Physical Exam Vitals and nursing note reviewed.  Constitutional:      Comments: Uncomfortable  HENT:     Head: Normocephalic.     Comments: Right TM is normal.  But however right ear canal there is a hematoma likely from Q-tip injury.  No obvious otitis externa.  Left TM and  canal is normal    Mouth/Throat:     Mouth: Mucous membranes are moist.  Eyes:     Extraocular Movements: Extraocular movements intact.     Pupils: Pupils are equal, round, and reactive to light.  Cardiovascular:     Rate and Rhythm: Normal rate and regular rhythm.  Pulmonary:     Effort: Pulmonary effort is normal.     Breath sounds: Normal breath sounds.  Abdominal:     General: Bowel sounds are normal.     Palpations: Abdomen is soft.  Musculoskeletal:        General: Normal range of motion.     Cervical back: Normal range of motion and neck supple.  Skin:    General: Skin is warm.  Neurological:     Mental Status: He is alert and oriented to person, place, and time.     Comments: Cranial nerves II to XII intact.  Patient has normal strength and sensation bilateral arms and legs.  Psychiatric:        Mood and Affect: Mood normal.        Behavior: Behavior normal.     ED Results / Procedures / Treatments   Labs (all labs ordered are listed, but only  abnormal results are displayed) Labs Reviewed  CBC WITH DIFFERENTIAL/PLATELET  BASIC METABOLIC PANEL    EKG None  Radiology No results found.  Procedures Procedures    Medications Ordered in ED Medications  promethazine (PHENERGAN) 25 mg in sodium chloride 0.9 % 50 mL IVPB (has no administration in time range)  dexamethasone (DECADRON) injection 10 mg (has no administration in time range)  sodium chloride 0.9 % bolus 1,000 mL (has no administration in time range)  metoCLOPramide (REGLAN) injection 10 mg (has no administration in time range)  diphenhydrAMINE (BENADRYL) injection 25 mg (has no administration in time range)    ED Course/ Medical Decision Making/ A&P                                 Medical Decision Making Harold Castillo is a 47 y.o. male here presenting with headaches.  Patient also has right ear pain and likely had right ear canal hematoma from Q-tip injury.  Patient's TM is intact.  Patient  has been having headaches for several weeks now.  At this point we will get CT head to rule out a mass.  Will also give migraine cocktail and check labs and reassess.    7:15 PM Labs unremarkable.  CT head is unremarkable.  Given migraine cocktail and felt better.  Patient is homeless and has a lot of social situations.  I offered to prescribe some medicines for headaches but he states that he cannot afford it.  He also works 7 days a week.  Patient states that he does not qualify for charity care either.  Told him if the headache is not improved with Tylenol or Motrin he should return to the ER.   Problems Addressed: Other migraine without status migrainosus, not intractable: acute illness or injury  Amount and/or Complexity of Data Reviewed Labs: ordered. Decision-making details documented in ED Course. Radiology: ordered.  Risk Prescription drug management.    Final Clinical Impression(s) / ED Diagnoses Final diagnoses:  None    Rx / DC Orders ED Discharge Orders     None         Charlynne Pander, MD 11/12/22 [phone]

## 2022-11-12 NOTE — Discharge Instructions (Signed)
You have chronic migraines.  Continue to take Tylenol or Motrin for headache.  If the headache is worse you can come to the ER  As we discussed, you have hematoma of your right ear canal.  Please avoid putting any Q-tips in it.  See your doctor for follow-up  Return to ER if you have worse headache or blurry vision or ear pain

## 2022-11-12 NOTE — ED Triage Notes (Signed)
Blurred vision also

## 2022-11-12 NOTE — ED Notes (Signed)
Received report on this patient from [place], [place]. Pt placed up for discharge at this time.

## 2022-11-12 NOTE — ED Triage Notes (Signed)
The pt is c/o a headache for 2 weeks   since last pm he has had a severe rt earache  he has tried remedies bu they have not helped

## 2022-11-20 ENCOUNTER — Emergency Department (HOSPITAL_COMMUNITY)
Admission: EM | Admit: 2022-11-20 | Discharge: 2022-11-20 | Disposition: A | Discharge status: Home / Self Care | Payer: Self-pay | Attending: Emergency Medicine | Admitting: Emergency Medicine

## 2022-11-20 ENCOUNTER — Encounter (HOSPITAL_COMMUNITY): Payer: Self-pay

## 2022-11-20 DIAGNOSIS — M2550 Pain in unspecified joint: Secondary | ICD-10-CM | POA: Insufficient documentation

## 2022-11-20 MED ORDER — NAPROXEN 500 MG PO TABS: 500.0000 mg | ORAL_TABLET | Freq: Two times a day (BID) | ORAL | 0 refills | Status: DC

## 2022-11-20 MED ORDER — KETOROLAC TROMETHAMINE 15 MG/ML IJ SOLN
15.0000 mg | Freq: Once | INTRAMUSCULAR | Status: AC
  Administered 2022-11-20: 15 mg via INTRAMUSCULAR
  Filled 2022-11-20: qty 1

## 2022-11-20 NOTE — ED Triage Notes (Signed)
POV c/o BUE and BLE joint pain and decreased ROM for 2 weeks. OTC does not help pain. Hematoma in right ear canal dx 8 days ago, feels full of fluid.

## 2022-11-20 NOTE — ED Provider Notes (Signed)
[place] EMERGENCY DEPARTMENT AT [facility] Provider Note   CSN: [id number] Arrival date & time: 11/20/22  1331     History  Chief Complaint  Patient presents with   Joint Pain    Harold Castillo is a 47 y.o. male.  47 year old male with prior medical history as detailed below presents for evaluation.  Patient complains of persistent diffuse achy pain " all over" for at least the last 2 weeks.  He denies associated trauma or fever.  He denies shortness of breath or chest pain.  He reports intermittent use of over-the-counter anti-inflammatories with minimal improvement or control of his pain.  The history is provided by the patient and medical records.       Home Medications Prior to Admission medications   Medication Sig Start Date End Date Taking? Authorizing Provider  ibuprofen (ADVIL,MOTRIN) 200 MG tablet Take 400 mg by mouth every 6 (six) hours as needed for headache, mild pain or moderate pain.    [provider]  ondansetron (ZOFRAN) 4 MG tablet Take 1 tablet (4 mg total) by mouth every 6 (six) hours. 01/29/22   Al Decant, PA-C  vitamin B-12 (CYANOCOBALAMIN) 1000 MCG tablet Take 1,000 mcg by mouth daily.    [provider]      Allergies    Patient has no known allergies.    Review of Systems   Review of Systems  All other systems reviewed and are negative.   Physical Exam Updated Vital Signs BP 123/79 (BP Location: Left Arm)   Pulse (!) 116   Temp 98.2 F (36.8 C) (Oral)   Resp 16   SpO2 92%  Physical Exam Vitals and nursing note reviewed.  Constitutional:      General: He is not in acute distress.    Appearance: Normal appearance. He is well-developed.  HENT:     Head: Normocephalic and atraumatic.  Eyes:     Conjunctiva/sclera: Conjunctivae normal.     Pupils: Pupils are equal, round, and reactive to light.  Cardiovascular:     Rate and Rhythm: Normal rate and regular rhythm.     Heart sounds: Normal  heart sounds.  Pulmonary:     Effort: Pulmonary effort is normal. No respiratory distress.     Breath sounds: Normal breath sounds.  Abdominal:     General: There is no distension.     Palpations: Abdomen is soft.     Tenderness: There is no abdominal tenderness.  Musculoskeletal:        General: No deformity. Normal range of motion.     Cervical back: Normal range of motion and neck supple.  Skin:    General: Skin is warm and dry.  Neurological:     General: No focal deficit present.     Mental Status: He is alert and oriented to person, place, and time.     ED Results / Procedures / Treatments   Labs (all labs ordered are listed, but only abnormal results are displayed) Labs Reviewed - No data to display  EKG None  Radiology No results found.  Procedures Procedures    Medications Ordered in ED Medications  ketorolac (TORADOL) 15 MG/ML injection 15 mg (has no administration in time range)    ED Course/ Medical Decision Making/ A&P                                 Medical Decision Making  Risk Prescription drug management.    Medical Screen Complete  This patient presented to the ED with complaint of diffuse joint pain.  This complaint involves an extensive number of treatment options. The initial differential diagnosis includes, but is not limited to, polyarthralgia  This presentation is: Chronic, Self-Limited, Previously Undiagnosed, and Uncertain Prognosis  Patient is primarily complaining of chronic joint pain.  Patient without acute medical complaint or indication for acute medical workup.   Feels improved after treatment here in the ED.  Patient understands need for close outpatient follow-up.  Strict return precautions given and understood. Additional history obtained:  External records from outside sources obtained and reviewed including prior ED visits and prior Inpatient records.    Problem List / ED Course:  Polyarthralgia,  chronic   Reevaluation:  After the interventions noted above, I reevaluated the patient and found that they have: improved   Disposition:  After consideration of the diagnostic results and the patients response to treatment, I feel that the patent would benefit from close outpatient follow-up.          Final Clinical Impression(s) / ED Diagnoses Final diagnoses:  Polyarthralgia    Rx / DC Orders ED Discharge Orders     None         Wynetta Fines, MD 11/20/22 [phone]

## 2022-11-20 NOTE — Discharge Instructions (Signed)
Return for any problem.  ?

## 2023-06-12 DIAGNOSIS — G43E11 Chronic migraine with aura, intractable, with status migrainosus: Secondary | ICD-10-CM | POA: Diagnosis not present

## 2023-06-12 DIAGNOSIS — Z0000 Encounter for general adult medical examination without abnormal findings: Secondary | ICD-10-CM | POA: Diagnosis not present

## 2023-06-12 DIAGNOSIS — M2550 Pain in unspecified joint: Secondary | ICD-10-CM | POA: Diagnosis not present

## 2023-06-12 DIAGNOSIS — Z125 Encounter for screening for malignant neoplasm of prostate: Secondary | ICD-10-CM | POA: Diagnosis not present

## 2023-06-12 DIAGNOSIS — Z13220 Encounter for screening for lipoid disorders: Secondary | ICD-10-CM | POA: Diagnosis not present

## 2023-06-12 DIAGNOSIS — F17200 Nicotine dependence, unspecified, uncomplicated: Secondary | ICD-10-CM | POA: Diagnosis not present

## 2023-06-12 DIAGNOSIS — Z131 Encounter for screening for diabetes mellitus: Secondary | ICD-10-CM | POA: Diagnosis not present

## 2023-06-12 DIAGNOSIS — F909 Attention-deficit hyperactivity disorder, unspecified type: Secondary | ICD-10-CM | POA: Diagnosis not present

## 2023-06-25 DIAGNOSIS — Z1211 Encounter for screening for malignant neoplasm of colon: Secondary | ICD-10-CM | POA: Diagnosis not present

## 2023-06-25 DIAGNOSIS — Z6821 Body mass index (BMI) 21.0-21.9, adult: Secondary | ICD-10-CM | POA: Diagnosis not present

## 2023-06-25 DIAGNOSIS — F17210 Nicotine dependence, cigarettes, uncomplicated: Secondary | ICD-10-CM | POA: Diagnosis not present

## 2023-06-26 DIAGNOSIS — M2550 Pain in unspecified joint: Secondary | ICD-10-CM | POA: Diagnosis not present

## 2023-06-26 DIAGNOSIS — M67912 Unspecified disorder of synovium and tendon, left shoulder: Secondary | ICD-10-CM | POA: Diagnosis not present

## 2023-06-26 DIAGNOSIS — G2581 Restless legs syndrome: Secondary | ICD-10-CM | POA: Diagnosis not present

## 2023-06-26 DIAGNOSIS — F17200 Nicotine dependence, unspecified, uncomplicated: Secondary | ICD-10-CM | POA: Diagnosis not present

## 2023-06-26 DIAGNOSIS — F909 Attention-deficit hyperactivity disorder, unspecified type: Secondary | ICD-10-CM | POA: Diagnosis not present

## 2023-06-26 DIAGNOSIS — R519 Headache, unspecified: Secondary | ICD-10-CM | POA: Diagnosis not present

## 2023-06-26 DIAGNOSIS — E781 Pure hyperglyceridemia: Secondary | ICD-10-CM | POA: Diagnosis not present

## 2023-07-04 DIAGNOSIS — Z1211 Encounter for screening for malignant neoplasm of colon: Secondary | ICD-10-CM | POA: Diagnosis not present

## 2023-07-04 DIAGNOSIS — K635 Polyp of colon: Secondary | ICD-10-CM | POA: Diagnosis not present

## 2023-07-18 DIAGNOSIS — K635 Polyp of colon: Secondary | ICD-10-CM | POA: Diagnosis not present

## 2023-07-23 ENCOUNTER — Other Ambulatory Visit: Payer: Self-pay

## 2023-07-23 ENCOUNTER — Ambulatory Visit (HOSPITAL_COMMUNITY)
Admission: EM | Admit: 2023-07-23 | Discharge: 2023-07-23 | Disposition: A | Discharge status: Home / Self Care | Attending: Family Medicine | Admitting: Family Medicine

## 2023-07-23 ENCOUNTER — Encounter (HOSPITAL_COMMUNITY): Payer: Self-pay | Admitting: Emergency Medicine

## 2023-07-23 DIAGNOSIS — G43C1 Periodic headache syndromes in child or adult, intractable: Secondary | ICD-10-CM

## 2023-07-23 DIAGNOSIS — M24541 Contracture, right hand: Secondary | ICD-10-CM | POA: Diagnosis not present

## 2023-07-23 DIAGNOSIS — S6701XA Crushing injury of right thumb, initial encounter: Secondary | ICD-10-CM | POA: Diagnosis not present

## 2023-07-23 HISTORY — DX: Headache, unspecified: R51.9

## 2023-07-23 MED ORDER — METOCLOPRAMIDE HCL 5 MG/ML IJ SOLN
5.0000 mg | Freq: Once | INTRAMUSCULAR | Status: AC
  Administered 2023-07-23: 5 mg via INTRAMUSCULAR

## 2023-07-23 MED ORDER — KETOROLAC TROMETHAMINE 30 MG/ML IJ SOLN
30.0000 mg | Freq: Once | INTRAMUSCULAR | Status: AC
  Administered 2023-07-23: 30 mg via INTRAMUSCULAR

## 2023-07-23 MED ORDER — DEXAMETHASONE SODIUM PHOSPHATE 10 MG/ML IJ SOLN
10.0000 mg | Freq: Once | INTRAMUSCULAR | Status: AC
  Administered 2023-07-23: 10 mg via INTRAMUSCULAR

## 2023-07-23 MED ORDER — RIZATRIPTAN BENZOATE 10 MG PO TBDP: 10.0000 mg | ORAL_TABLET | ORAL | 0 refills | Status: AC | PRN

## 2023-07-23 MED ORDER — KETOROLAC TROMETHAMINE 30 MG/ML IJ SOLN
INTRAMUSCULAR | Status: AC
  Filled 2023-07-23: qty 1

## 2023-07-23 MED ORDER — SUMATRIPTAN SUCCINATE 100 MG PO TABS: 100.0000 mg | ORAL_TABLET | ORAL | 0 refills | Status: DC | PRN

## 2023-07-23 MED ORDER — DEXAMETHASONE SODIUM PHOSPHATE 10 MG/ML IJ SOLN
INTRAMUSCULAR | Status: AC
  Filled 2023-07-23: qty 1

## 2023-07-23 MED ORDER — METOCLOPRAMIDE HCL 5 MG/ML IJ SOLN
INTRAMUSCULAR | Status: AC
  Filled 2023-07-23: qty 2

## 2023-07-23 NOTE — Discharge Instructions (Addendum)
 You have been given a shot of Toradol  30 mg, dexamethasone  10 mg, and metoclopramide  5 mg today.   Maxalt  dissolved in your mouth as needed for migraine headache.  You can repeat the dose every 2 hours up to 3 tablets in 24 hours.

## 2023-07-23 NOTE — ED Provider Notes (Addendum)
 [facility]    CSN: [id number] Arrival date & time: 07/23/23  1106      History   Chief Complaint Chief Complaint  Patient presents with   headache    HPI Harold Castillo is a 48 y.o. male.   HPI Here for severe migraine headache.  It began yesterday morning and he is vomited several times with it and is nauseated.  There is photophobia.  This is typical for his usual migraines.  He very often has to get a headache cocktail in the emergency room.  On review of the chart this does not usually include Imitrex .  NKDA   Past Medical History:  Diagnosis Date   Frequent headaches    Hypertension     There are no active problems to display for this patient.   Past Surgical History:  Procedure Laterality Date   APPENDECTOMY         Home Medications    Prior to Admission medications   Medication Sig Start Date End Date Taking? Authorizing Provider  meloxicam (MOBIC) 7.5 MG tablet TAKE 1-2 TABS ORALLY ONCE A DAY 07/18/23  Yes [provider]  rizatriptan  (MAXALT -MLT) 10 MG disintegrating tablet Take 1 tablet (10 mg total) by mouth as needed for migraine. May repeat in 2 hours if needed 07/23/23  Yes Jarquavious Fentress, Sharlet POUR, MD  rOPINIRole  (REQUIP ) 0.5 MG tablet Take 1 tablet by mouth. 06/26/23  Yes [provider]  ibuprofen  (ADVIL ,MOTRIN ) 200 MG tablet Take 400 mg by mouth every 6 (six) hours as needed for headache, mild pain or moderate pain.    [provider]    Family History History reviewed. No pertinent family history.  Social History Social History   Tobacco Use   Smoking status: Every Day    Current packs/day: 1.00    Types: Cigarettes   Smokeless tobacco: Never  Vaping Use   Vaping status: Every Day  Substance Use Topics   Alcohol use: No   Drug use: Yes    Types: Marijuana     Allergies   Patient has no known allergies.   Review of Systems Review of Systems   Physical Exam Triage Vital Signs ED Triage  Vitals  Encounter Vitals Group     BP 07/23/23 1150 125/86     Girls Systolic BP Percentile --      Girls Diastolic BP Percentile --      Boys Systolic BP Percentile --      Boys Diastolic BP Percentile --      Pulse Rate 07/23/23 1150 76     Resp 07/23/23 1150 20     Temp 07/23/23 1150 98.2 F (36.8 C)     Temp Source 07/23/23 1150 Oral     SpO2 07/23/23 1150 97 %     Weight --      Height --      Head Circumference --      Peak Flow --      Pain Score 07/23/23 1148 8     Pain Loc --      Pain Education --      Exclude from Growth Chart --    No data found.  Updated Vital Signs BP 125/86 (BP Location: Left Arm)   Pulse 76   Temp 98.2 F (36.8 C) (Oral)   Resp 20   SpO2 97%   Visual Acuity Right Eye Distance:   Left Eye Distance:   Bilateral Distance:    Right Eye Near:  Left Eye Near:    Bilateral Near:     Physical Exam Vitals reviewed.  Constitutional:      General: He is not in acute distress.    Appearance: He is not toxic-appearing.  HENT:     Mouth/Throat:     Mouth: Mucous membranes are moist.  Eyes:     Extraocular Movements: Extraocular movements intact.     Conjunctiva/sclera: Conjunctivae normal.     Pupils: Pupils are equal, round, and reactive to light.  Cardiovascular:     Rate and Rhythm: Normal rate and regular rhythm.  Pulmonary:     Effort: Pulmonary effort is normal.     Breath sounds: Normal breath sounds.  Neurological:     General: No focal deficit present.     Mental Status: He is alert and oriented to person, place, and time.     Cranial Nerves: No cranial nerve deficit.  Psychiatric:        Behavior: Behavior normal.      UC Treatments / Results  Labs (all labs ordered are listed, but only abnormal results are displayed) Labs Reviewed - No data to display  EKG   Radiology No results found.  Procedures Procedures (including critical care time)  Medications Ordered in UC Medications  ketorolac  (TORADOL ) 30  MG/ML injection 30 mg (30 mg Intramuscular Given 07/23/23 1230)  metoCLOPramide  (REGLAN ) injection 5 mg (5 mg Intramuscular Given 07/23/23 1231)  dexamethasone  (DECADRON ) injection 10 mg (10 mg Intramuscular Given 07/23/23 1230)    Initial Impression / Assessment and Plan / UC Course  I have reviewed the triage vital signs and the nursing notes.  Pertinent labs & imaging results that were available during my care of the patient were reviewed by me and considered in my medical decision making (see chart for details).     Toradol  30, Reglan  5, and dexamethasone  10 mg are given here as an injection.  I have sent some sumatriptan  tablets for him to try the next time he has a migraine to see if they are helpful.  At discharge patient asked for a different prescription.  He requested Maxalt  instead of sumatriptan  hand, so that prescription is sent in the sumatriptan  tablet prescription is cancelled  Final diagnoses:  Intractable periodic headache syndrome     Discharge Instructions      You have been given a shot of Toradol  30 mg, dexamethasone  10 mg, and metoclopramide  5 mg today.   Maxalt  dissolved in your mouth as needed for migraine headache.  You can repeat the dose every 2 hours up to 3 tablets in 24 hours.      ED Prescriptions     Medication Sig Dispense Auth. Provider   SUMAtriptan  (IMITREX ) 100 MG tablet  (Status: Discontinued) Take 1 tablet (100 mg total) by mouth every 2 (two) hours as needed for migraine. May repeat in 2 hours if headache persists or recurs. 10 tablet Vonna Sharlet POUR, MD   rizatriptan  (MAXALT -MLT) 10 MG disintegrating tablet Take 1 tablet (10 mg total) by mouth as needed for migraine. May repeat in 2 hours if needed 10 tablet Marico Buckle K, MD      PDMP not reviewed this encounter.   Vonna Sharlet POUR, MD 07/23/23 1226    Vonna Sharlet POUR, MD 07/23/23 [phone]

## 2023-07-23 NOTE — ED Notes (Signed)
 Turned lights down for patient comfort

## 2023-07-23 NOTE — ED Triage Notes (Signed)
 Its a migraine and I want a cocktail to go to sleep  Patient has a headache.  Patient typically goes to ED for shot-does not know what is in the shot, just knows it is the only thing that works and its what he needs   Patient has taken goody powders and ibuprofen .    Hot and cold symptoms.  Reports vomiting episodes.

## 2023-07-24 ENCOUNTER — Other Ambulatory Visit: Payer: Self-pay

## 2023-07-24 ENCOUNTER — Observation Stay (HOSPITAL_COMMUNITY)
Admission: EM | Admit: 2023-07-24 | Discharge: 2023-07-25 | Disposition: A | Discharge status: Home / Self Care | Source: Ambulatory Visit | Attending: Family Medicine | Admitting: Family Medicine

## 2023-07-24 ENCOUNTER — Emergency Department (HOSPITAL_COMMUNITY)

## 2023-07-24 ENCOUNTER — Encounter (HOSPITAL_COMMUNITY): Payer: Self-pay | Admitting: *Deleted

## 2023-07-24 DIAGNOSIS — R252 Cramp and spasm: Secondary | ICD-10-CM | POA: Diagnosis not present

## 2023-07-24 DIAGNOSIS — M62838 Other muscle spasm: Secondary | ICD-10-CM | POA: Diagnosis not present

## 2023-07-24 DIAGNOSIS — F17210 Nicotine dependence, cigarettes, uncomplicated: Secondary | ICD-10-CM | POA: Diagnosis not present

## 2023-07-24 DIAGNOSIS — R0789 Other chest pain: Secondary | ICD-10-CM | POA: Diagnosis not present

## 2023-07-24 DIAGNOSIS — N179 Acute kidney failure, unspecified: Secondary | ICD-10-CM | POA: Diagnosis not present

## 2023-07-24 DIAGNOSIS — G43909 Migraine, unspecified, not intractable, without status migrainosus: Secondary | ICD-10-CM | POA: Diagnosis not present

## 2023-07-24 DIAGNOSIS — E860 Dehydration: Secondary | ICD-10-CM | POA: Diagnosis not present

## 2023-07-24 DIAGNOSIS — R3911 Hesitancy of micturition: Secondary | ICD-10-CM | POA: Diagnosis not present

## 2023-07-24 DIAGNOSIS — E871 Hypo-osmolality and hyponatremia: Secondary | ICD-10-CM | POA: Diagnosis not present

## 2023-07-24 DIAGNOSIS — R519 Headache, unspecified: Secondary | ICD-10-CM | POA: Diagnosis not present

## 2023-07-24 DIAGNOSIS — F12920 Cannabis use, unspecified with intoxication, uncomplicated: Secondary | ICD-10-CM | POA: Insufficient documentation

## 2023-07-24 DIAGNOSIS — R634 Abnormal weight loss: Secondary | ICD-10-CM | POA: Diagnosis not present

## 2023-07-24 DIAGNOSIS — R112 Nausea with vomiting, unspecified: Secondary | ICD-10-CM | POA: Diagnosis not present

## 2023-07-24 LAB — CBC WITH DIFFERENTIAL/PLATELET
Abs Immature Granulocytes: 0.04 K/uL (ref 0.00–0.07)
Basophils Absolute: 0 K/uL (ref 0.0–0.1)
Basophils Relative: 0 %
Eosinophils Absolute: 0 K/uL (ref 0.0–0.5)
Eosinophils Relative: 0 %
HCT: 50.6 % (ref 39.0–52.0)
Hemoglobin: 18 g/dL — ABNORMAL HIGH (ref 13.0–17.0)
Immature Granulocytes: 0 %
Lymphocytes Relative: 13 %
Lymphs Abs: 1.3 K/uL (ref 0.7–4.0)
MCH: 32.2 pg (ref 26.0–34.0)
MCHC: 35.6 g/dL (ref 30.0–36.0)
MCV: 90.5 fL (ref 80.0–100.0)
Monocytes Absolute: 1.5 K/uL — ABNORMAL HIGH (ref 0.1–1.0)
Monocytes Relative: 15 %
Neutro Abs: 7 K/uL (ref 1.7–7.7)
Neutrophils Relative %: 72 %
Platelets: 140 K/uL — ABNORMAL LOW (ref 150–400)
RBC: 5.59 MIL/uL (ref 4.22–5.81)
RDW: 12.3 % (ref 11.5–15.5)
WBC: 9.8 K/uL (ref 4.0–10.5)
nRBC: 0 % (ref 0.0–0.2)

## 2023-07-24 LAB — RAPID URINE DRUG SCREEN, HOSP PERFORMED
Amphetamines: NOT DETECTED
Barbiturates: NOT DETECTED
Benzodiazepines: NOT DETECTED
Cocaine: NOT DETECTED
Opiates: NOT DETECTED
Tetrahydrocannabinol: POSITIVE — AB

## 2023-07-24 LAB — BASIC METABOLIC PANEL WITH GFR
Anion gap: 13 (ref 5–15)
BUN: 41 mg/dL — ABNORMAL HIGH (ref 6–20)
CO2: 19 mmol/L — ABNORMAL LOW (ref 22–32)
Calcium: 8.4 mg/dL — ABNORMAL LOW (ref 8.9–10.3)
Chloride: 98 mmol/L (ref 98–111)
Creatinine, Ser: 2.12 mg/dL — ABNORMAL HIGH (ref 0.61–1.24)
GFR, Estimated: 38 mL/min — ABNORMAL LOW (ref >60)
Glucose, Bld: 112 mg/dL — ABNORMAL HIGH (ref 70–99)
Potassium: 3.5 mmol/L (ref 3.5–5.1)
Sodium: 130 mmol/L — ABNORMAL LOW (ref 135–145)

## 2023-07-24 LAB — COMPREHENSIVE METABOLIC PANEL WITH GFR
ALT: 35 U/L (ref 0–44)
AST: 36 U/L (ref 15–41)
Albumin: 4.9 g/dL (ref 3.5–5.0)
Alkaline Phosphatase: 88 U/L (ref 38–126)
Anion gap: 20 — ABNORMAL HIGH (ref 5–15)
BUN: 41 mg/dL — ABNORMAL HIGH (ref 6–20)
CO2: 13 mmol/L — ABNORMAL LOW (ref 22–32)
Calcium: 9.4 mg/dL (ref 8.9–10.3)
Chloride: 94 mmol/L — ABNORMAL LOW (ref 98–111)
Creatinine, Ser: 3.24 mg/dL — ABNORMAL HIGH (ref 0.61–1.24)
GFR, Estimated: 23 mL/min — ABNORMAL LOW (ref >60)
Glucose, Bld: 139 mg/dL — ABNORMAL HIGH (ref 70–99)
Potassium: 3.7 mmol/L (ref 3.5–5.1)
Sodium: 127 mmol/L — ABNORMAL LOW (ref 135–145)
Total Bilirubin: 0.5 mg/dL (ref 0.0–1.2)
Total Protein: 8.7 g/dL — ABNORMAL HIGH (ref 6.5–8.1)

## 2023-07-24 LAB — URINALYSIS, ROUTINE W REFLEX MICROSCOPIC
Bacteria, UA: NONE SEEN
Bilirubin Urine: NEGATIVE
Glucose, UA: NEGATIVE mg/dL
Ketones, ur: NEGATIVE mg/dL
Leukocytes,Ua: NEGATIVE
Nitrite: NEGATIVE
Protein, ur: 100 mg/dL — AB
Specific Gravity, Urine: 1.018 (ref 1.005–1.030)
pH: 5 (ref 5.0–8.0)

## 2023-07-24 LAB — CBC
HCT: 48.1 % (ref 39.0–52.0)
Hemoglobin: 16.9 g/dL (ref 13.0–17.0)
MCH: 32.2 pg (ref 26.0–34.0)
MCHC: 35.1 g/dL (ref 30.0–36.0)
MCV: 91.6 fL (ref 80.0–100.0)
Platelets: 127 K/uL — ABNORMAL LOW (ref 150–400)
RBC: 5.25 MIL/uL (ref 4.22–5.81)
RDW: 12.3 % (ref 11.5–15.5)
WBC: 7.5 K/uL (ref 4.0–10.5)
nRBC: 0 % (ref 0.0–0.2)

## 2023-07-24 LAB — CK: Total CK: 287 U/L (ref 49–397)

## 2023-07-24 LAB — CREATININE, SERUM
Creatinine, Ser: 2.68 mg/dL — ABNORMAL HIGH (ref 0.61–1.24)
GFR, Estimated: 29 mL/min — ABNORMAL LOW (ref >60)

## 2023-07-24 LAB — TROPONIN I (HIGH SENSITIVITY)
Troponin I (High Sensitivity): 15 ng/L (ref <18)
Troponin I (High Sensitivity): 12 ng/L (ref <18)

## 2023-07-24 LAB — MAGNESIUM: Magnesium: 2.3 mg/dL (ref 1.7–2.4)

## 2023-07-24 LAB — ETHANOL: Alcohol, Ethyl (B): <15 mg/dL (ref <15)

## 2023-07-24 MED ORDER — SODIUM CHLORIDE 0.9 % IV SOLN: INTRAVENOUS | Status: DC

## 2023-07-24 MED ORDER — ALBUTEROL SULFATE (2.5 MG/3ML) 0.083% IN NEBU: 2.5000 mg | INHALATION_SOLUTION | RESPIRATORY_TRACT | Status: DC | PRN

## 2023-07-24 MED ORDER — SODIUM CHLORIDE 0.9 % IV SOLN: Freq: Once | INTRAVENOUS | Status: AC

## 2023-07-24 MED ORDER — SUMATRIPTAN SUCCINATE 50 MG PO TABS
50.0000 mg | ORAL_TABLET | ORAL | Status: DC | PRN
  Administered 2023-07-25: 50 mg via ORAL
  Filled 2023-07-24 (×2): qty 1

## 2023-07-24 MED ORDER — ACETAMINOPHEN 650 MG RE SUPP
650.0000 mg | Freq: Four times a day (QID) | RECTAL | Status: DC | PRN
Start: 2023-07-24 — End: 2023-07-25

## 2023-07-24 MED ORDER — ACETAMINOPHEN 325 MG PO TABS: 650.0000 mg | ORAL_TABLET | Freq: Four times a day (QID) | ORAL | Status: DC | PRN

## 2023-07-24 MED ORDER — BUTALBITAL-APAP-CAFFEINE 50-325-40 MG PO TABS
2.0000 | ORAL_TABLET | ORAL | Status: DC | PRN
  Filled 2023-07-24: qty 2

## 2023-07-24 MED ORDER — ONDANSETRON HCL 4 MG PO TABS: 4.0000 mg | ORAL_TABLET | Freq: Four times a day (QID) | ORAL | Status: DC | PRN

## 2023-07-24 MED ORDER — SODIUM CHLORIDE 0.9 % IV BOLUS: 1000.0000 mL | Freq: Once | INTRAVENOUS | Status: DC

## 2023-07-24 MED ORDER — RIZATRIPTAN BENZOATE 10 MG PO TBDP: 10.0000 mg | ORAL_TABLET | ORAL | Status: DC | PRN

## 2023-07-24 MED ORDER — DIPHENHYDRAMINE HCL 50 MG/ML IJ SOLN
12.5000 mg | Freq: Once | INTRAMUSCULAR | Status: AC
  Administered 2023-07-24: 12.5 mg via INTRAVENOUS
  Filled 2023-07-24: qty 1

## 2023-07-24 MED ORDER — TRAZODONE HCL 50 MG PO TABS: 25.0000 mg | ORAL_TABLET | Freq: Every evening | ORAL | Status: DC | PRN

## 2023-07-24 MED ORDER — PROCHLORPERAZINE EDISYLATE 10 MG/2ML IJ SOLN
10.0000 mg | Freq: Once | INTRAMUSCULAR | Status: AC
  Administered 2023-07-24: 10 mg via INTRAVENOUS
  Filled 2023-07-24: qty 2

## 2023-07-24 MED ORDER — ONDANSETRON HCL 4 MG/2ML IJ SOLN: 4.0000 mg | Freq: Four times a day (QID) | INTRAMUSCULAR | Status: DC | PRN

## 2023-07-24 MED ORDER — ROPINIROLE HCL 0.25 MG PO TABS
0.5000 mg | ORAL_TABLET | Freq: Three times a day (TID) | ORAL | Status: DC
  Administered 2023-07-24 – 2023-07-25 (×3): 0.5 mg via ORAL
  Filled 2023-07-24: qty 2
  Filled 2023-07-24: qty 1
  Filled 2023-07-24: qty 2

## 2023-07-24 MED ORDER — HEPARIN SODIUM (PORCINE) 5000 UNIT/ML IJ SOLN: 5000.0000 [IU] | Freq: Three times a day (TID) | INTRAMUSCULAR | Status: DC

## 2023-07-24 MED ORDER — KETOROLAC TROMETHAMINE 15 MG/ML IJ SOLN
15.0000 mg | Freq: Once | INTRAMUSCULAR | Status: AC
  Administered 2023-07-24: 15 mg via INTRAVENOUS
  Filled 2023-07-24: qty 1

## 2023-07-24 NOTE — ED Triage Notes (Addendum)
 Here by POV, sent from PCP for eval. Endorses HA, agitated, restless, irritable, insomnia, nausea, body spasms/ cramping. Describes as severe. Rates pain 10/10. Seen at [facility] yesterday and migraine cocktail has not lasted or helped much. Went to PCP for routine visit per pt. Alert, NAD, articulate, steady gait. Appears red, and diaphoretic.

## 2023-07-24 NOTE — Progress Notes (Addendum)
 Pt admitted to the unit, alert and oriented, mild headache, stated improvement, ambulatory, room air, oriented to room and unit, call light within reach, significant other at bedside, admission completed by Amber,RN. IVF initiated. PRNs reviewed. Pt requested to smoke a cigarette and nicotine patch offered to patient. Patient declined. Fall and safety precautions maintained. Pt verbalized understanding of plan of care.

## 2023-07-24 NOTE — ED Provider Notes (Signed)
  EMERGENCY DEPARTMENT AT [facility] Provider Note   CSN: [id number] Arrival date & time: 07/24/23  1153     Patient presents with: Spasms   Harold Castillo is a 48 y.o. male who presents today with a history of continued headache, restlessness, muscle spasms across the body, insomnia, reduced appetite.  Seen at primary care and referred to the ED for further evaluation of the same.  He was seen at urgent care yesterday and was provided with a migraine cocktail which provided him temporary relief however symptoms have resumed.  He further states that he has a cramping in his chest that has been persistent over the last week.  Family member that is at bedside states that he has had about a 7 pound weight loss over the last week secondary to poor p.o. intake.  In review of this patient's chart history, there are multiple visits noted for the same over the last year.   HPI     Prior to Admission medications   Medication Sig Start Date End Date Taking? Authorizing Provider  ibuprofen  (ADVIL ,MOTRIN ) 200 MG tablet Take 400 mg by mouth every 6 (six) hours as needed for headache, mild pain or moderate pain.    [provider]  meloxicam (MOBIC) 7.5 MG tablet TAKE 1-2 TABS ORALLY ONCE A DAY 07/18/23   [provider]  rizatriptan  (MAXALT -MLT) 10 MG disintegrating tablet Take 1 tablet (10 mg total) by mouth as needed for migraine. May repeat in 2 hours if needed 07/23/23   Vonna Sharlet POUR, MD  rOPINIRole  (REQUIP ) 0.5 MG tablet Take 1 tablet by mouth. 06/26/23   [provider]    Allergies: Patient has no known allergies.    Review of Systems  Constitutional:  Positive for appetite change and fatigue.  Gastrointestinal:  Positive for nausea and vomiting.  Neurological:  Positive for weakness and headaches.  All other systems reviewed and are negative.   Updated Vital Signs BP 125/88 (BP Location: Left Arm)   Pulse 95   Temp 98 F (36.7 C)  (Oral)   Resp 18   Wt 67.6 kg   SpO2 99%   BMI 20.78 kg/m   Physical Exam Vitals and nursing note reviewed.  Constitutional:      General: He is not in acute distress.    Appearance: Normal appearance.  HENT:     Head: Normocephalic and atraumatic.     Mouth/Throat:     Mouth: Mucous membranes are moist.     Pharynx: Oropharynx is clear.  Eyes:     General: Lids are normal. Vision grossly intact. No visual field deficit.    Extraocular Movements: Extraocular movements intact.     Conjunctiva/sclera: Conjunctivae normal.     Pupils: Pupils are equal, round, and reactive to light.  Cardiovascular:     Rate and Rhythm: Normal rate and regular rhythm.     Pulses: Normal pulses.     Heart sounds: Normal heart sounds, S1 normal and S2 normal. No murmur heard.    No friction rub. No gallop.  Pulmonary:     Effort: Pulmonary effort is normal.     Breath sounds: Normal breath sounds.  Abdominal:     General: Abdomen is flat. Bowel sounds are normal.     Palpations: Abdomen is soft.  Musculoskeletal:        General: Normal range of motion.     Cervical back: Normal range of motion and neck supple.  Skin:  General: Skin is warm and dry.     Capillary Refill: Capillary refill takes less than 2 seconds.  Neurological:     General: No focal deficit present.     Mental Status: He is alert and oriented to person, place, and time. Mental status is at baseline.     GCS: GCS eye subscore is 4. GCS verbal subscore is 5. GCS motor subscore is 6.     Cranial Nerves: No cranial nerve deficit, dysarthria or facial asymmetry.     Sensory: Sensation is intact.     Motor: Motor function is intact.  Psychiatric:        Mood and Affect: Mood normal.     (all labs ordered are listed, but only abnormal results are displayed) Labs Reviewed  COMPREHENSIVE METABOLIC PANEL WITH GFR - Abnormal; Notable for the following components:      Result Value   Sodium 127 (*)    Chloride 94 (*)    CO2  13 (*)    Glucose, Bld 139 (*)    BUN 41 (*)    Creatinine, Ser 3.24 (*)    Total Protein 8.7 (*)    GFR, Estimated 23 (*)    Anion gap 20 (*)    All other components within normal limits  CBC WITH DIFFERENTIAL/PLATELET - Abnormal; Notable for the following components:   Hemoglobin 18.0 (*)    Platelets 140 (*)    Monocytes Absolute 1.5 (*)    All other components within normal limits  ETHANOL  CK  MAGNESIUM  RAPID URINE DRUG SCREEN, HOSP PERFORMED  URINALYSIS, ROUTINE W REFLEX MICROSCOPIC  HIV ANTIBODY (ROUTINE TESTING W REFLEX)  CBC  CREATININE, SERUM  TROPONIN I (HIGH SENSITIVITY)  TROPONIN I (HIGH SENSITIVITY)    EKG: EKG Interpretation Date/Time:  Tuesday July 24 2023 12:45:34 EDT Ventricular Rate:  70 PR Interval:  158 QRS Duration:  98 QT Interval:  354 QTC Calculation: 382 R Axis:   88  Text Interpretation: Sinus rhythm No significant change since last tracing Reconfirmed by Jerrol Agent ([id number]) on 07/24/2023 2:40:45 PM  Radiology: DG Chest 1 View Result Date: 07/24/2023 CLINICAL DATA:  Chest discomfort EXAM: CHEST  1 VIEW COMPARISON:  Chest radiograph dated 06/09/2012 FINDINGS: Normal lung volumes. No focal consolidations. No pleural effusion or pneumothorax. The heart size and mediastinal contours are within normal limits. No acute osseous abnormality. IMPRESSION: No acute disease. Electronically Signed   By: Limin  Xu M.D.   On: 07/24/2023 13:41   CT Head Wo Contrast Result Date: 07/24/2023 CLINICAL DATA:  Headache, increasing frequency or severity EXAM: CT HEAD WITHOUT CONTRAST TECHNIQUE: Contiguous axial images were obtained from the base of the skull through the vertex without intravenous contrast. RADIATION DOSE REDUCTION: This exam was performed according to the departmental dose-optimization program which includes automated exposure control, adjustment of the mA and/or kV according to patient size and/or use of iterative reconstruction technique. COMPARISON:  CT  head 11/12/2022 FINDINGS: Brain: No evidence of large-territorial acute infarction. No parenchymal hemorrhage. No mass lesion. No extra-axial collection. No mass effect or midline shift. No hydrocephalus. Basilar cisterns are patent. Vascular: No hyperdense vessel. Skull: No acute fracture or focal lesion. Sinuses/Orbits: Paranasal sinuses and mastoid air cells are clear. The orbits are unremarkable. Other: None. IMPRESSION: No acute intracranial abnormality. Electronically Signed   By: Morgane  Naveau M.D.   On: 07/24/2023 13:25     Procedures   Medications Ordered in the ED  rOPINIRole  (REQUIP ) tablet 0.5 mg (0.5  mg Oral Given 07/24/23 1540)  rizatriptan  (MAXALT -MLT) disintegrating tablet 10 mg (has no administration in time range)  butalbital -acetaminophen -caffeine  (FIORICET ) 50-325-40 MG per tablet 2 tablet (has no administration in time range)  heparin  injection 5,000 Units (has no administration in time range)  acetaminophen  (TYLENOL ) tablet 650 mg (has no administration in time range)    Or  acetaminophen  (TYLENOL ) suppository 650 mg (has no administration in time range)  traZODone  (DESYREL ) tablet 25 mg (has no administration in time range)  ondansetron  (ZOFRAN ) tablet 4 mg (has no administration in time range)    Or  ondansetron  (ZOFRAN ) injection 4 mg (has no administration in time range)  albuterol  (PROVENTIL ) (2.5 MG/3ML) 0.083% nebulizer solution 2.5 mg (has no administration in time range)  ketorolac  (TORADOL ) 15 MG/ML injection 15 mg (15 mg Intravenous Given 07/24/23 1434)  prochlorperazine  (COMPAZINE ) injection 10 mg (10 mg Intravenous Given 07/24/23 1433)  diphenhydrAMINE  (BENADRYL ) injection 12.5 mg (12.5 mg Intravenous Given 07/24/23 1434)  0.9 %  sodium chloride  infusion ( Intravenous New Bag/Given 07/24/23 1435)                                    Medical Decision Making Amount and/or Complexity of Data Reviewed Labs: ordered. Radiology: ordered.  Risk Prescription drug  management. Decision regarding hospitalization.   Medical Decision Making:   Harold Castillo is a 48 y.o. male who presented to the ED today with headache, body aches detailed above.    Additional history discussed with patient's family/caregivers.  External chart has been reviewed including previous imaging and lab work. Complete initial physical exam performed, notably the patient  was alert and oriented in no apparent distress.  Neurologic exam at this time is benign.    Reviewed and confirmed nursing documentation for past medical history, family history, social history.    Initial Assessment:   With the patient's presentation of headache and bodyaches, most likely diagnosis is sequelae of chronic migraines.  Differential diagnosis includes electrolyte abnormality, potential malignancy, other chronic disease.  Initial Plan:  CT imaging of the head to evaluate for acute neurovascular insult and/or malignancy. Screening labs including CBC and Metabolic panel to evaluate for infectious or metabolic etiology of disease.  Urinalysis with reflex culture ordered to evaluate for UTI or relevant urologic/nephrologic pathology.  CXR to evaluate for structural/infectious intrathoracic pathology.  EKG and serial troponin to evaluate for cardiac pathology. Objective evaluation as below reviewed   Initial Study Results:   Laboratory  All laboratory results reviewed without evidence of clinically relevant pathology.   Exceptions include: Hyponatremia of 127, creatinine is elevated to 3.24, GFR is decreased to 23.  Hemoglobin is increased to 18  EKG EKG was reviewed independently. Rate, rhythm, axis, intervals all examined and without medically relevant abnormality. ST segments without concerns for elevations.    Radiology:  All images reviewed independently. Agree with radiology report at this time.   DG Chest 1 View Result Date: 07/24/2023 CLINICAL DATA:  Chest discomfort EXAM: CHEST  1 VIEW  COMPARISON:  Chest radiograph dated 06/09/2012 FINDINGS: Normal lung volumes. No focal consolidations. No pleural effusion or pneumothorax. The heart size and mediastinal contours are within normal limits. No acute osseous abnormality. IMPRESSION: No acute disease. Electronically Signed   By: Limin  Xu M.D.   On: 07/24/2023 13:41   CT Head Wo Contrast Result Date: 07/24/2023 CLINICAL DATA:  Headache, increasing frequency or severity EXAM: CT HEAD  WITHOUT CONTRAST TECHNIQUE: Contiguous axial images were obtained from the base of the skull through the vertex without intravenous contrast. RADIATION DOSE REDUCTION: This exam was performed according to the departmental dose-optimization program which includes automated exposure control, adjustment of the mA and/or kV according to patient size and/or use of iterative reconstruction technique. COMPARISON:  CT head 11/12/2022 FINDINGS: Brain: No evidence of large-territorial acute infarction. No parenchymal hemorrhage. No mass lesion. No extra-axial collection. No mass effect or midline shift. No hydrocephalus. Basilar cisterns are patent. Vascular: No hyperdense vessel. Skull: No acute fracture or focal lesion. Sinuses/Orbits: Paranasal sinuses and mastoid air cells are clear. The orbits are unremarkable. Other: None. IMPRESSION: No acute intracranial abnormality. Electronically Signed   By: Morgane  Naveau M.D.   On: 07/24/2023 13:25      Consults: Case discussed with Dr. Zella with the hospitalist team who agrees to accept this patient for admission..   Reassessment and Plan:   Based on findings of hyponatremia, believe patient's muscle cramps secondary to hyponatremia and fluid volume deficit.  As such have begun fluid resuscitation with normal saline at 250 mL/h.  I provided him with migraine cocktail as noted, also provided home prescription of ropinirole .  Have consulted with hospitalist team as noted who agrees to accept this patient for admission for  hyponatremia and AKI.       Final diagnoses:  Hyponatremia  AKI (acute kidney injury) [facility])    ED Discharge Orders     None          Myriam Dorn BROCKS, PA 07/24/23 1547    Kammerer, Megan L, DO 07/25/23 [phone]

## 2023-07-24 NOTE — H&P (Signed)
 History and Physical  Harold Castillo FMW:[id number] DOB: 1975-11-03 DOA: 07/24/2023  PCP: Rosalea Rosina SAILOR, PA   Chief Complaint: Headache, muscle spasms  HPI: Harold Castillo is a 48 y.o. male with medical history significant for hypertension and frequent migraine headaches being admitted to the hospital with severe dehydration and acute kidney injury.  Patient states that he has had daily headaches for over a week now, with associated cramping, anorexia, he estimates he has lost about 7 or 8 pounds over the last week due to poor intake from feeling nauseous from his migraines.  He was recently just prescribed Maxalt  which has worked for him in the past, has not filled the prescription yet.  He went to urgent care yesterday and today followed up with his PCP, was sent to the ER for evaluation.  Workup in the ER as detailed below shows evidence of significant dehydration with hyponatremia.  He has been started on aggressive normal saline infusion, and hospitalist admission was requested.  Review of Systems: Please see HPI for pertinent positives and negatives. A complete 10 system review of systems are otherwise negative.  Past Medical History:  Diagnosis Date   Frequent headaches    Hypertension    Past Surgical History:  Procedure Laterality Date   APPENDECTOMY     Social History:  reports that he has been smoking cigarettes. He has never used smokeless tobacco. He reports current drug use. Drug: Marijuana. He reports that he does not drink alcohol.  No Known Allergies  History reviewed. No pertinent family history.   Prior to Admission medications   Medication Sig Start Date End Date Taking? Authorizing Provider  ibuprofen  (ADVIL ,MOTRIN ) 200 MG tablet Take 400 mg by mouth every 6 (six) hours as needed for headache, mild pain or moderate pain.    [provider]  meloxicam (MOBIC) 7.5 MG tablet TAKE 1-2 TABS ORALLY ONCE A DAY 07/18/23   [provider]   rizatriptan  (MAXALT -MLT) 10 MG disintegrating tablet Take 1 tablet (10 mg total) by mouth as needed for migraine. May repeat in 2 hours if needed 07/23/23   Vonna Sharlet POUR, MD  rOPINIRole  (REQUIP ) 0.5 MG tablet Take 1 tablet by mouth. 06/26/23   [provider]    Physical Exam: BP 125/88 (BP Location: Left Arm)   Pulse 95   Temp 98 F (36.7 C) (Oral)   Resp 18   Wt 67.6 kg   SpO2 99%   BMI 20.78 kg/m  General:  Alert, oriented, calm, in no acute distress, he looks very dry on exam.  He looks slightly uncomfortable, his wife is at the bedside. Cardiovascular: RRR, no murmurs or rubs, no peripheral edema  Respiratory: clear to auscultation bilaterally, no wheezes, no crackles  Abdomen: soft, nontender, nondistended, normal bowel tones heard  Skin: dry, no rashes  Musculoskeletal: no joint effusions, normal range of motion  Psychiatric: appropriate affect, normal speech  Neurologic: extraocular muscles intact, clear speech, moving all extremities with intact sensorium         Labs on Admission:  Basic Metabolic Panel: Recent Labs  Lab 07/24/23 1255  NA 127*  K 3.7  CL 94*  CO2 13*  GLUCOSE 139*  BUN 41*  CREATININE 3.24*  CALCIUM 9.4  MG 2.3   Liver Function Tests: Recent Labs  Lab 07/24/23 1255  AST 36  ALT 35  ALKPHOS 88  BILITOT 0.5  PROT 8.7*  ALBUMIN 4.9   No results for input(s): LIPASE, AMYLASE in the  last 168 hours. No results for input(s): AMMONIA in the last 168 hours. CBC: Recent Labs  Lab 07/24/23 1255  WBC 9.8  NEUTROABS 7.0  HGB 18.0*  HCT 50.6  MCV 90.5  PLT 140*   Cardiac Enzymes: Recent Labs  Lab 07/24/23 1255  CKTOTAL 287   BNP (last 3 results) No results for input(s): BNP in the last 8760 hours.  ProBNP (last 3 results) No results for input(s): PROBNP in the last 8760 hours.  CBG: No results for input(s): GLUCAP in the last 168 hours.  Radiological Exams on Admission: DG Chest 1 View Result Date:  07/24/2023 CLINICAL DATA:  Chest discomfort EXAM: CHEST  1 VIEW COMPARISON:  Chest radiograph dated 06/09/2012 FINDINGS: Normal lung volumes. No focal consolidations. No pleural effusion or pneumothorax. The heart size and mediastinal contours are within normal limits. No acute osseous abnormality. IMPRESSION: No acute disease. Electronically Signed   By: Limin  Xu M.D.   On: 07/24/2023 13:41   CT Head Wo Contrast Result Date: 07/24/2023 CLINICAL DATA:  Headache, increasing frequency or severity EXAM: CT HEAD WITHOUT CONTRAST TECHNIQUE: Contiguous axial images were obtained from the base of the skull through the vertex without intravenous contrast. RADIATION DOSE REDUCTION: This exam was performed according to the departmental dose-optimization program which includes automated exposure control, adjustment of the mA and/or kV according to patient size and/or use of iterative reconstruction technique. COMPARISON:  CT head 11/12/2022 FINDINGS: Brain: No evidence of large-territorial acute infarction. No parenchymal hemorrhage. No mass lesion. No extra-axial collection. No mass effect or midline shift. No hydrocephalus. Basilar cisterns are patent. Vascular: No hyperdense vessel. Skull: No acute fracture or focal lesion. Sinuses/Orbits: Paranasal sinuses and mastoid air cells are clear. The orbits are unremarkable. Other: None. IMPRESSION: No acute intracranial abnormality. Electronically Signed   By: Morgane  Naveau M.D.   On: 07/24/2023 13:25   Assessment/Plan Harold Castillo is a 48 y.o. male with medical history significant for hypertension and frequent migraine headaches being admitted to the hospital with severe dehydration and acute kidney injury  Severe dehydration-due to anorexia related to his severe migraine headaches and associated nausea and lack of appetite.  Dehydration as evidenced by AKI, hyponatremia, secondary polycythemia.  No evidence of rhabdomyolysis. -Observation admission -Aggressive  hydration -Treat migraine  Migraine-head CT without acute findings, continue Maxalt  as needed  Hyponatremia-suspect this is a hypovolemic hyponatremia given his severe dehydration -Continue aggressive fluid resuscitation -Will recheck BMP this evening, and again in the morning  Acute kidney injury-superimposed on baseline normal renal function, likely ATN from dehydration  DVT prophylaxis: Subcutaneous heparin     Code Status: Full Code  Consults called: None  Admission status: Observation  Time spent: 48 minutes  Oshea Percival CHRISTELLA Gail MD [facility] Pager [phone]  If 7PM-7AM, please contact night-coverage [email or web address] Password [facility]  07/24/2023, 3:33 PM

## 2023-07-25 DIAGNOSIS — N179 Acute kidney failure, unspecified: Secondary | ICD-10-CM | POA: Diagnosis not present

## 2023-07-25 DIAGNOSIS — E860 Dehydration: Secondary | ICD-10-CM | POA: Insufficient documentation

## 2023-07-25 LAB — CBC
HCT: 45.1 % (ref 39.0–52.0)
Hemoglobin: 15.7 g/dL (ref 13.0–17.0)
MCH: 32.1 pg (ref 26.0–34.0)
MCHC: 34.8 g/dL (ref 30.0–36.0)
MCV: 92.2 fL (ref 80.0–100.0)
Platelets: 108 K/uL — ABNORMAL LOW (ref 150–400)
RBC: 4.89 MIL/uL (ref 4.22–5.81)
RDW: 12.3 % (ref 11.5–15.5)
WBC: 5.9 K/uL (ref 4.0–10.5)
nRBC: 0 % (ref 0.0–0.2)

## 2023-07-25 LAB — BASIC METABOLIC PANEL WITH GFR
Anion gap: 8 (ref 5–15)
BUN: 34 mg/dL — ABNORMAL HIGH (ref 6–20)
CO2: 19 mmol/L — ABNORMAL LOW (ref 22–32)
Calcium: 8.3 mg/dL — ABNORMAL LOW (ref 8.9–10.3)
Chloride: 105 mmol/L (ref 98–111)
Creatinine, Ser: 1.46 mg/dL — ABNORMAL HIGH (ref 0.61–1.24)
GFR, Estimated: 60 mL/min — ABNORMAL LOW (ref >60)
Glucose, Bld: 97 mg/dL (ref 70–99)
Potassium: 3.6 mmol/L (ref 3.5–5.1)
Sodium: 132 mmol/L — ABNORMAL LOW (ref 135–145)

## 2023-07-25 LAB — HIV ANTIBODY (ROUTINE TESTING W REFLEX): HIV Screen 4th Generation wRfx: NONREACTIVE

## 2023-07-25 NOTE — TOC CM/SW Note (Signed)
 Transition of Care [facility]) - Inpatient Brief Assessment   Patient Details  Name: ALEXANDRIA CURRENT MRN: [id number] Date of Birth: 01-Dec-1975  Transition of Care [facility]) CM/SW Contact:    Doneta Glenys DASEN, RN Phone Number: 07/25/2023, 10:40 AM   Clinical Narrative: No TOC needs identified during visit.   Transition of Care Asessment: Insurance and Status: Insurance coverage has been reviewed Patient has primary care physician: Yes Home environment has been reviewed: Lives in house with girlfriend Sueann Mayer Prior level of function:: Independent Prior/Current Home Services: No current home services Social Drivers of Health Review: SDOH reviewed no interventions necessary Readmission risk has been reviewed: No Transition of care needs: no transition of care needs at this time

## 2023-07-25 NOTE — Plan of Care (Signed)

## 2023-07-25 NOTE — Progress Notes (Signed)
 Pt ready for discharge. Denies pain. Ivf discontinued and IV removed. AVS printed and reviewed. Educated on dehydration and lab work. Encouraged to follow up with pcp in 1-2 weeks. Requested work note, Public house manager. All questions asked and answered. Pt ambulatory and transferred off unit with staff.

## 2023-07-25 NOTE — Discharge Summary (Signed)
 Physician Discharge Summary  [name]:[id number] DOB: 1975-11-14 DOA: 07/24/2023  PCP: Rosalea Rosina SAILOR, PA  Admit date: 07/24/2023 Discharge date: 07/25/2023    Admitted From: Home Disposition: Home Home  Recommendations for Outpatient Follow-up:  Follow up with PCP in 1-2 weeks Please obtain BMP/CBC in one week Please follow up with your PCP on the following pending results: Unresulted Labs (From admission, onward)    None         Home Health: None Equipment/Devices: None  Discharge Condition: Stable CODE STATUS: Full code Diet recommendation: Cardiac  Due to brief hospitalization, I have copied admitting hospitalist HPI as below. HPI: Harold Castillo is a 48 y.o. male with medical history significant for hypertension and frequent migraine headaches being admitted to the hospital with severe dehydration and acute kidney injury.  Patient states that he has had daily headaches for over a week now, with associated cramping, anorexia, he estimates he has lost about 7 or 8 pounds over the last week due to poor intake from feeling nauseous from his migraines.  He was recently just prescribed Maxalt  which has worked for him in the past, has not filled the prescription yet.  He went to urgent care yesterday and today followed up with his PCP, was sent to the ER for evaluation.  Workup in the ER as detailed below shows evidence of significant dehydration with hyponatremia.  He has been started on aggressive normal saline infusion, and hospitalist admission was requested.   Subjective: Seen and examined.  Feeling well.  He was able to consume full dinner and breakfast this morning without having any symptoms.  Headache is resolved.  He also had a bowel movement.  He feels very comfortable going home.  Brief/Interim Summary: Patient was admitted due to dehydration leading to AKI in the setting of nausea and vomiting and poor p.o. intake secondary to migraine headache.  His  headache resolved, no further nausea vomiting, he has been tolerating p.o. and ate full diet last night and this morning and he is back to normal.  AKI resolved.  He is being discharged in stable condition.  Resume home medications.  Discharge plan was discussed with patient and/or family member and they verbalized understanding and agreed with it.  Discharge Diagnoses:  Principal Problem:   AKI (acute kidney injury) (HCC) Active Problems:   Dehydration    Discharge Instructions   Allergies as of 07/25/2023   No Known Allergies      Medication List     TAKE these medications    ibuprofen  200 MG tablet Commonly known as: ADVIL  Take 400 mg by mouth every 6 (six) hours as needed for headache, mild pain or moderate pain.   MAGNESIUM PO Take 1 tablet by mouth in the morning.   meloxicam 7.5 MG tablet Commonly known as: MOBIC Take 1-2 tablets by mouth daily.   Potassium 99 MG Tabs Take 1 tablet by mouth every evening.   QC TUMERIC COMPLEX PO Take 1 tablet by mouth every evening.   rizatriptan  10 MG disintegrating tablet Commonly known as: Maxalt -MLT Take 1 tablet (10 mg total) by mouth as needed for migraine. May repeat in 2 hours if needed   rOPINIRole  0.5 MG tablet Commonly known as: REQUIP  Take 1 tablet by mouth 2 (two) times daily.        Follow-up Information     Rosalea Rosina SAILOR, PA Follow up in 1 week(s).   Specialty: Physician Assistant Contact information: [phone]  [place] [address] [phone]                No Known Allergies  Consultations: None   Procedures/Studies: DG Chest 1 View Result Date: 07/24/2023 CLINICAL DATA:  Chest discomfort EXAM: CHEST  1 VIEW COMPARISON:  Chest radiograph dated 06/09/2012 FINDINGS: Normal lung volumes. No focal consolidations. No pleural effusion or pneumothorax. The heart size and mediastinal contours are within normal limits. No acute osseous abnormality. IMPRESSION: No acute disease.  Electronically Signed   By: Limin  Xu M.D.   On: 07/24/2023 13:41   CT Head Wo Contrast Result Date: 07/24/2023 CLINICAL DATA:  Headache, increasing frequency or severity EXAM: CT HEAD WITHOUT CONTRAST TECHNIQUE: Contiguous axial images were obtained from the base of the skull through the vertex without intravenous contrast. RADIATION DOSE REDUCTION: This exam was performed according to the departmental dose-optimization program which includes automated exposure control, adjustment of the mA and/or kV according to patient size and/or use of iterative reconstruction technique. COMPARISON:  CT head 11/12/2022 FINDINGS: Brain: No evidence of large-territorial acute infarction. No parenchymal hemorrhage. No mass lesion. No extra-axial collection. No mass effect or midline shift. No hydrocephalus. Basilar cisterns are patent. Vascular: No hyperdense vessel. Skull: No acute fracture or focal lesion. Sinuses/Orbits: Paranasal sinuses and mastoid air cells are clear. The orbits are unremarkable. Other: None. IMPRESSION: No acute intracranial abnormality. Electronically Signed   By: Morgane  Naveau M.D.   On: 07/24/2023 13:25     Discharge Exam: Vitals:   07/25/23 0219 07/25/23 0517  BP: 119/71 124/78  Pulse: 65 65  Resp: 16 16  Temp: 98 F (36.7 C) 98.1 F (36.7 C)  SpO2: 97% 98%   Vitals:   07/24/23 1835 07/24/23 2349 07/25/23 0219 07/25/23 0517  BP:  109/70 119/71 124/78  Pulse:  64 65 65  Resp:  16 16 16   Temp:  97.9 F (36.6 C) 98 F (36.7 C) 98.1 F (36.7 C)  TempSrc:      SpO2:  95% 97% 98%  Weight:      Height: 5' 11 (1.803 m)       General: Pt is alert, awake, not in acute distress Cardiovascular: RRR, S1/S2 +, no rubs, no gallops Respiratory: CTA bilaterally, no wheezing, no rhonchi Abdominal: Soft, NT, ND, bowel sounds + Extremities: no edema, no cyanosis    The results of significant diagnostics from this hospitalization (including imaging, microbiology, ancillary and  laboratory) are listed below for reference.     Microbiology: No results found for this or any previous visit (from the past 240 hours).   Labs: BNP (last 3 results) No results for input(s): BNP in the last 8760 hours. Basic Metabolic Panel: Recent Labs  Lab 07/24/23 1255 07/24/23 1814 07/24/23 2123 07/25/23 0436  NA 127*  --  130* 132*  K 3.7  --  3.5 3.6  CL 94*  --  98 105  CO2 13*  --  19* 19*  GLUCOSE 139*  --  112* 97  BUN 41*  --  41* 34*  CREATININE 3.24* 2.68* 2.12* 1.46*  CALCIUM 9.4  --  8.4* 8.3*  MG 2.3  --   --   --    Liver Function Tests: Recent Labs  Lab 07/24/23 1255  AST 36  ALT 35  ALKPHOS 88  BILITOT 0.5  PROT 8.7*  ALBUMIN 4.9   No results for input(s): LIPASE, AMYLASE in the last 168 hours. No results for input(s): AMMONIA in the last 168 hours.  CBC: Recent Labs  Lab 07/24/23 1255 07/24/23 1814 07/25/23 0436  WBC 9.8 7.5 5.9  NEUTROABS 7.0  --   --   HGB 18.0* 16.9 15.7  HCT 50.6 48.1 45.1  MCV 90.5 91.6 92.2  PLT 140* 127* 108*   Cardiac Enzymes: Recent Labs  Lab 07/24/23 1255  CKTOTAL 287   BNP: Invalid input(s): POCBNP CBG: No results for input(s): GLUCAP in the last 168 hours. D-Dimer No results for input(s): DDIMER in the last 72 hours. Hgb A1c No results for input(s): HGBA1C in the last 72 hours. Lipid Profile No results for input(s): CHOL, HDL, LDLCALC, TRIG, CHOLHDL, LDLDIRECT in the last 72 hours. Thyroid function studies No results for input(s): TSH, T4TOTAL, T3FREE, THYROIDAB in the last 72 hours.  Invalid input(s): FREET3 Anemia work up No results for input(s): VITAMINB12, FOLATE, FERRITIN, TIBC, IRON, RETICCTPCT in the last 72 hours. Urinalysis    Component Value Date/Time   COLORURINE YELLOW 07/24/2023 1642   APPEARANCEUR HAZY (A) 07/24/2023 1642   LABSPEC 1.018 07/24/2023 1642   PHURINE 5.0 07/24/2023 1642   GLUCOSEU NEGATIVE 07/24/2023 1642   HGBUR  MODERATE (A) 07/24/2023 1642   BILIRUBINUR NEGATIVE 07/24/2023 1642   KETONESUR NEGATIVE 07/24/2023 1642   PROTEINUR 100 (A) 07/24/2023 1642   UROBILINOGEN 1.0 06/09/2012 2349   NITRITE NEGATIVE 07/24/2023 1642   LEUKOCYTESUR NEGATIVE 07/24/2023 1642   Sepsis Labs Recent Labs  Lab 07/24/23 1255 07/24/23 1814 07/25/23 0436  WBC 9.8 7.5 5.9   Microbiology No results found for this or any previous visit (from the past 240 hours).  FURTHER DISCHARGE INSTRUCTIONS:   Get Medicines reviewed and adjusted: Please take all your medications with you for your next visit with your Primary MD   Laboratory/radiological data: Please request your Primary MD to go over all hospital tests and procedure/radiological results at the follow up, please ask your Primary MD to get all Hospital records sent to his/her office.   In some cases, they will be blood work, cultures and biopsy results pending at the time of your discharge. Please request that your primary care M.D. goes through all the records of your hospital data and follows up on these results.   Also Note the following: If you experience worsening of your admission symptoms, develop shortness of breath, life threatening emergency, suicidal or homicidal thoughts you must seek medical attention immediately by calling 911 or calling your MD immediately  if symptoms less severe.   You must read complete instructions/literature along with all the possible adverse reactions/side effects for all the Medicines you take and that have been prescribed to you. Take any new Medicines after you have completely understood and accpet all the possible adverse reactions/side effects.    patient was instructed, not to drive, operate heavy machinery, perform activities at heights, swimming or participation in water activities or provide baby-sitting services while on Pain, Sleep and Anxiety Medications; until their outpatient Physician has advised to do so again.  Also recommended to not to take more than prescribed Pain, Sleep and Anxiety Medications.  It is not advisable to combine anxiety, sleep and pain medications without talking with your primary care provider.     Wear Seat belts while driving.   Please note: You were cared for by a hospitalist during your hospital stay. Once you are discharged, your primary care physician will handle any further medical issues. Please note that NO REFILLS for any discharge medications will be authorized once you are discharged, as it is  imperative that you return to your primary care physician (or establish a relationship with a primary care physician if you do not have one) for your post hospital discharge needs so that they can reassess your need for medications and monitor your lab values  Time coordinating discharge: Over 30 minutes  SIGNED:   Fredia Skeeter, MD  [facility] 07/25/2023, 10:37 AM *Please note that this is a verbal dictation therefore any spelling or grammatical errors are due to the Dragon [employer] One system interpretation. If 7PM-7AM, please contact night-coverage [email or web address]

## 2023-07-31 DIAGNOSIS — R29898 Other symptoms and signs involving the musculoskeletal system: Secondary | ICD-10-CM | POA: Diagnosis not present

## 2023-08-06 DIAGNOSIS — K648 Other hemorrhoids: Secondary | ICD-10-CM | POA: Diagnosis not present

## 2023-08-06 DIAGNOSIS — F17210 Nicotine dependence, cigarettes, uncomplicated: Secondary | ICD-10-CM | POA: Diagnosis not present

## 2023-08-06 DIAGNOSIS — Z860101 Personal history of adenomatous and serrated colon polyps: Secondary | ICD-10-CM | POA: Diagnosis not present

## 2023-08-06 DIAGNOSIS — Z6821 Body mass index (BMI) 21.0-21.9, adult: Secondary | ICD-10-CM | POA: Diagnosis not present

## 2023-09-18 DIAGNOSIS — E781 Pure hyperglyceridemia: Secondary | ICD-10-CM | POA: Diagnosis not present

## 2023-09-18 DIAGNOSIS — F17200 Nicotine dependence, unspecified, uncomplicated: Secondary | ICD-10-CM | POA: Diagnosis not present

## 2023-09-18 DIAGNOSIS — G2581 Restless legs syndrome: Secondary | ICD-10-CM | POA: Diagnosis not present

## 2023-09-18 DIAGNOSIS — M24541 Contracture, right hand: Secondary | ICD-10-CM | POA: Diagnosis not present

## 2023-09-18 DIAGNOSIS — M2550 Pain in unspecified joint: Secondary | ICD-10-CM | POA: Diagnosis not present

## 2023-09-18 DIAGNOSIS — S6701XD Crushing injury of right thumb, subsequent encounter: Secondary | ICD-10-CM | POA: Diagnosis not present

## 2023-09-18 DIAGNOSIS — G43E11 Chronic migraine with aura, intractable, with status migrainosus: Secondary | ICD-10-CM | POA: Diagnosis not present

## 2023-09-18 DIAGNOSIS — F909 Attention-deficit hyperactivity disorder, unspecified type: Secondary | ICD-10-CM | POA: Diagnosis not present
